# Patient Record
Sex: Female | Born: 1945 | Race: White | Hispanic: No | Marital: Married | Smoking: Former smoker
Health system: Southern US, Community
[De-identification: ages and names within clinical notes are randomized; demographics above are authoritative.]

## PROBLEM LIST (undated history)

## (undated) DIAGNOSIS — G20A1 Parkinson's disease without dyskinesia, without mention of fluctuations: Secondary | ICD-10-CM

## (undated) DIAGNOSIS — R1310 Dysphagia, unspecified: Secondary | ICD-10-CM

## (undated) DIAGNOSIS — F419 Anxiety disorder, unspecified: Secondary | ICD-10-CM

## (undated) DIAGNOSIS — G20 Parkinson's disease: Secondary | ICD-10-CM

---

## 1998-09-18 ENCOUNTER — Inpatient Hospital Stay (HOSPITAL_COMMUNITY): Admission: EM | Admit: 1998-09-18 | Discharge: 1998-09-21 | Payer: Self-pay

## 1998-09-20 ENCOUNTER — Encounter: Payer: Self-pay | Admitting: Internal Medicine

## 1998-10-11 ENCOUNTER — Ambulatory Visit (HOSPITAL_COMMUNITY): Admission: RE | Admit: 1998-10-11 | Discharge: 1998-10-11 | Payer: Self-pay | Admitting: Internal Medicine

## 1998-10-11 ENCOUNTER — Encounter: Payer: Self-pay | Admitting: Internal Medicine

## 1998-10-14 ENCOUNTER — Ambulatory Visit (HOSPITAL_COMMUNITY): Admission: RE | Admit: 1998-10-14 | Discharge: 1998-10-14 | Payer: Self-pay | Admitting: Internal Medicine

## 1998-10-14 ENCOUNTER — Encounter: Payer: Self-pay | Admitting: Internal Medicine

## 1998-12-14 ENCOUNTER — Ambulatory Visit (HOSPITAL_COMMUNITY): Admission: RE | Admit: 1998-12-14 | Discharge: 1998-12-14 | Payer: Self-pay | Admitting: Internal Medicine

## 1998-12-14 ENCOUNTER — Encounter: Payer: Self-pay | Admitting: Internal Medicine

## 1999-03-07 ENCOUNTER — Ambulatory Visit (HOSPITAL_COMMUNITY): Admission: RE | Admit: 1999-03-07 | Discharge: 1999-03-07 | Payer: Self-pay | Admitting: Internal Medicine

## 1999-03-07 ENCOUNTER — Encounter: Payer: Self-pay | Admitting: Internal Medicine

## 1999-12-01 ENCOUNTER — Other Ambulatory Visit: Admission: RE | Admit: 1999-12-01 | Discharge: 1999-12-01 | Payer: Self-pay | Admitting: Obstetrics and Gynecology

## 2000-01-03 ENCOUNTER — Other Ambulatory Visit: Admission: RE | Admit: 2000-01-03 | Discharge: 2000-01-03 | Payer: Self-pay | Admitting: Obstetrics and Gynecology

## 2001-04-15 ENCOUNTER — Other Ambulatory Visit: Admission: RE | Admit: 2001-04-15 | Discharge: 2001-04-15 | Payer: Self-pay | Admitting: Obstetrics and Gynecology

## 2009-09-01 HISTORY — PX: OTHER SURGICAL HISTORY: SHX169

## 2011-05-03 HISTORY — PX: SPLENECTOMY: SUR1306

## 2012-10-22 DIAGNOSIS — G20 Parkinson's disease: Secondary | ICD-10-CM | POA: Diagnosis not present

## 2012-11-28 DIAGNOSIS — G20 Parkinson's disease: Secondary | ICD-10-CM | POA: Diagnosis not present

## 2013-05-29 DIAGNOSIS — G20 Parkinson's disease: Secondary | ICD-10-CM | POA: Diagnosis not present

## 2013-07-08 DIAGNOSIS — Z23 Encounter for immunization: Secondary | ICD-10-CM | POA: Diagnosis not present

## 2013-10-11 ENCOUNTER — Emergency Department (INDEPENDENT_AMBULATORY_CARE_PROVIDER_SITE_OTHER)
Admission: EM | Admit: 2013-10-11 | Discharge: 2013-10-11 | Disposition: A | Discharge status: Home / Self Care | Payer: Medicare Other | Source: Home / Self Care | Attending: Emergency Medicine | Admitting: Emergency Medicine

## 2013-10-11 ENCOUNTER — Encounter: Payer: Self-pay | Admitting: Emergency Medicine

## 2013-10-11 ENCOUNTER — Emergency Department (INDEPENDENT_AMBULATORY_CARE_PROVIDER_SITE_OTHER): Payer: Medicare Other

## 2013-10-11 DIAGNOSIS — S20229A Contusion of unspecified back wall of thorax, initial encounter: Secondary | ICD-10-CM

## 2013-10-11 DIAGNOSIS — M545 Low back pain, unspecified: Secondary | ICD-10-CM | POA: Diagnosis not present

## 2013-10-11 DIAGNOSIS — G959 Disease of spinal cord, unspecified: Secondary | ICD-10-CM

## 2013-10-11 DIAGNOSIS — S59919A Unspecified injury of unspecified forearm, initial encounter: Secondary | ICD-10-CM | POA: Diagnosis not present

## 2013-10-11 DIAGNOSIS — S59909A Unspecified injury of unspecified elbow, initial encounter: Secondary | ICD-10-CM | POA: Diagnosis not present

## 2013-10-11 DIAGNOSIS — S60219A Contusion of unspecified wrist, initial encounter: Secondary | ICD-10-CM | POA: Diagnosis not present

## 2013-10-11 DIAGNOSIS — M79609 Pain in unspecified limb: Secondary | ICD-10-CM

## 2013-10-11 DIAGNOSIS — M25539 Pain in unspecified wrist: Secondary | ICD-10-CM | POA: Diagnosis not present

## 2013-10-11 DIAGNOSIS — S300XXA Contusion of lower back and pelvis, initial encounter: Secondary | ICD-10-CM

## 2013-10-11 DIAGNOSIS — S60211A Contusion of right wrist, initial encounter: Secondary | ICD-10-CM

## 2013-10-11 DIAGNOSIS — IMO0002 Reserved for concepts with insufficient information to code with codable children: Secondary | ICD-10-CM | POA: Diagnosis not present

## 2013-10-11 HISTORY — DX: Parkinson's disease: G20

## 2013-10-11 HISTORY — DX: Parkinson's disease without dyskinesia, without mention of fluctuations: G20.A1

## 2013-10-11 MED ORDER — IBUPROFEN 200 MG PO TABS: ORAL_TABLET | ORAL | Status: DC

## 2013-10-11 NOTE — ED Provider Notes (Signed)
CSN: [id number]     Arrival date & time 10/11/13  1257 History   First MD Initiated Contact with Patient 10/11/13 1323     Chief Complaint  Patient presents with  . Back Pain   Patient c/o low back pain and right wrist pain, states she fell down her stairs last Tuesday morning.  Patient is a 68 y.o. female presenting with back pain. The history is provided by the patient and the spouse.  Back Pain Location:  Lumbar spine Quality:  Aching Radiates to:  Does not radiate Pain severity:  Moderate Pain is:  Worse during the day Onset quality:  Sudden Duration:  4 days Timing:  Constant Progression:  Worsening Chronicity:  New Context: falling   Context: not occupational injury   Relieved by:  NSAIDs (Ibuprofen) Worsened by:  Bending and twisting Associated symptoms: no abdominal pain, no abdominal swelling, no chest pain, no dysuria, no fever, no headaches, no leg pain, no paresthesias and no pelvic pain   Risk factors: no steroid use   Risk factors comment:  Parkinson's disease  She and husband state that the Parkinson's disease has been fairly well controlled on the carbidopa-levodopa prescribed by her neurologist.  Denies loss of consciousness. Denies hip pain. She's been able to ambulate ever since the fall. Complains of right wrist pain, that's improving over time. Past Medical History  Diagnosis Date  . Parkinson disease    History reviewed. No pertinent past surgical history. History reviewed. No pertinent family history. History  Substance Use Topics  . Smoking status: Former Games developer  . Smokeless tobacco: Not on file  . Alcohol Use: No   OB History   Grav Para Term Preterm Abortions TAB SAB Ect Mult Living                 Review of Systems  Constitutional: Negative for fever.  Cardiovascular: Negative for chest pain.  Gastrointestinal: Negative for abdominal pain.  Genitourinary: Negative for dysuria and pelvic pain.  Musculoskeletal: Positive for back pain.   Neurological: Negative for headaches and paresthesias.  All other systems reviewed and are negative.    Allergies  Review of patient's allergies indicates no known allergies.  Home Medications   Current Outpatient Rx  Name  Route  Sig  Dispense  Refill  . carbidopa-levodopa (PARCOPA) 25-100 MG per disintegrating tablet   Oral   Take 1 tablet by mouth 3 (three) times daily.         Marland Kitchen ibuprofen (ADVIL,MOTRIN) 200 MG tablet      Take2 tablets ( 400 milligrams total) every 6 with food as needed for pain.   30 tablet   0    BP 135/86  Pulse 97  Temp(Src) 98.2 F (36.8 C) (Oral)  Resp 16  Ht 5\' 2"  (1.575 m)  Wt 112 lb (50.803 kg)  BMI 20.48 kg/m2  SpO2 96% Physical Exam  Nursing note and vitals reviewed. Constitutional: She is oriented to person, place, and time. She appears well-developed and well-nourished. No distress.  HENT:  Head: Normocephalic and atraumatic.  Mouth/Throat: Oropharynx is clear and moist.  Eyes: Conjunctivae and EOM are normal. Pupils are equal, round, and reactive to light. No scleral icterus.  Neck: Normal range of motion. No spinous process tenderness and no muscular tenderness present.  Cardiovascular: Normal rate, regular rhythm and normal heart sounds.   No murmur heard. Pulmonary/Chest: Effort normal and breath sounds normal. She exhibits no tenderness.  Abdominal: Soft. Normal appearance. She exhibits no distension.  There is no tenderness.  Musculoskeletal:       Right wrist: She exhibits tenderness, bony tenderness (With mild ecchymosis, distal ulna. Neurovascular distally and) and swelling (minimal, distal ulna. ).       Lumbar back: She exhibits decreased range of motion, tenderness and bony tenderness.  Neurological: She is alert and oriented to person, place, and time. She displays normal reflexes. No cranial nerve deficit or sensory deficit. Coordination normal.  Mild resting tremor right upper extremity . Flat facies, consistent with  Parkinson's.   Skin: Skin is warm. No rash noted.  Psychiatric: She has a normal mood and affect.  Mildly anxious   there is no open wound. She is able to walk without assistance ED Course  Procedures (including critical care time) Labs Review Labs Reviewed - No data to display Imaging Review Dg Lumbar Spine Complete  10/11/2013   CLINICAL DATA:  Fall down stairs with low back pain.  EXAM: LUMBAR SPINE - COMPLETE 4+ VIEW  COMPARISON:  None.  FINDINGS: No acute fracture or subluxation is identified. There is mild loss of the L2 vertebral body with approximately 20% loss of anterior vertebral body height. Based on appearance, this is likely old. No bony lesions are seen.  IMPRESSION: No acute fracture. 20% anterior compression of the L2 vertebral body likely represents old compression fracture.   Electronically Signed   By: Irish LackGlenn  Yamagata M.D.   On: 10/11/2013 14:32   Dg Wrist Complete Right  10/11/2013   CLINICAL DATA:  Wrist pain, fall  EXAM: RIGHT WRIST - COMPLETE 3+ VIEW  COMPARISON:  None.  FINDINGS: There is no evidence of fracture or dislocation. There is no evidence of arthropathy or other focal bone abnormality. Soft tissues are unremarkable.  IMPRESSION: Negative.   Electronically Signed   By: Ruel Favorsrevor  Shick M.D.   On: 10/11/2013 14:38    EKG Interpretation    Date/Time:    Ventricular Rate:    PR Interval:    QRS Duration:   QT Interval:    QTC Calculation:   R Axis:     Text Interpretation:              MDM   1. Lumbar contusion, initial encounter   2. Contusion, wrist, right, initial encounter    Reviewed x-rays and reports and results with patient and husband. No acute fractures seen. No dislocations. There is mild compression of the L2 vertebral body, likely representing old compression fracture, and patient and husband state that she had this prior diagnosis about 4 years ago.  Treatment options discussed, as well as risks, benefits, alternatives. Patient  and husband  voiced understanding and agreement with the following plans: Ibuprofen 200 mg, 2 every 6 hours with food, as this has helped the pain significantly. She declined any other pain medicine prescription or any muscle relaxant. Patient declined a wrist splint, as she prefers to either use one that she has at home or using Ace bandage that she has at home.--(Range of motion of right wrist is within normal limits and there is no significant pain with flexion or extension. Follow up with your primary care physician or specialist if not improving, having worsening of symptoms, or new severe symptoms.  Precautions discussed. Red flags discussed. Questions invited and answered. Patient and husband voiced understanding and agreement.   Lajean Manesavid Massey, MD 10/11/13 [phone]

## 2013-10-11 NOTE — ED Notes (Signed)
Patient c/o low back pain and neck stiffness states she fell down her stairs last Tuesday morning.

## 2013-11-04 DIAGNOSIS — G20 Parkinson's disease: Secondary | ICD-10-CM | POA: Diagnosis not present

## 2014-06-30 DIAGNOSIS — G20 Parkinson's disease: Secondary | ICD-10-CM | POA: Diagnosis not present

## 2014-07-15 DIAGNOSIS — Z23 Encounter for immunization: Secondary | ICD-10-CM | POA: Diagnosis not present

## 2014-10-04 ENCOUNTER — Emergency Department (INDEPENDENT_AMBULATORY_CARE_PROVIDER_SITE_OTHER)
Admission: EM | Admit: 2014-10-04 | Discharge: 2014-10-04 | Disposition: A | Discharge status: Home / Self Care | Payer: Medicare Other | Source: Home / Self Care | Attending: Family Medicine | Admitting: Family Medicine

## 2014-10-04 ENCOUNTER — Encounter: Payer: Self-pay | Admitting: Emergency Medicine

## 2014-10-04 DIAGNOSIS — N3090 Cystitis, unspecified without hematuria: Secondary | ICD-10-CM

## 2014-10-04 DIAGNOSIS — N390 Urinary tract infection, site not specified: Secondary | ICD-10-CM | POA: Diagnosis not present

## 2014-10-04 LAB — POCT URINALYSIS DIP (MANUAL ENTRY)
Bilirubin, UA: NEGATIVE
GLUCOSE UA: NEGATIVE
Ketones, POC UA: NEGATIVE
LEUKOCYTES UA: NEGATIVE
NITRITE UA: NEGATIVE
Protein Ur, POC: NEGATIVE
Spec Grav, UA: 1.02 (ref 1.005–1.03)
Urobilinogen, UA: 0.2 (ref 0–1)
pH, UA: 7 (ref 5–8)

## 2014-10-04 MED ORDER — PHENAZOPYRIDINE HCL 200 MG PO TABS: 200.0000 mg | ORAL_TABLET | Freq: Three times a day (TID) | ORAL | Status: DC

## 2014-10-04 MED ORDER — SULFAMETHOXAZOLE-TRIMETHOPRIM 800-160 MG PO TABS: 1.0000 | ORAL_TABLET | Freq: Two times a day (BID) | ORAL | Status: DC

## 2014-10-04 NOTE — ED Notes (Signed)
Reports sensation of pressure in bladder over past 1 week.

## 2014-10-04 NOTE — ED Provider Notes (Signed)
CSN: [id number]     Arrival date & time 10/04/14  1421 History   First MD Initiated Contact with Patient 10/04/14 1539     Chief Complaint  Patient presents with  . Dysuria      HPI Comments: During the past week patient has developed dysuria, urgency, and weaker urine stream.  She feels well otherwise.  Patient is a 69 y.o. female presenting with dysuria. The history is provided by the patient.  Dysuria Pain quality:  Burning Pain severity:  Mild Onset quality:  Gradual Duration:  1 week Timing:  Constant Progression:  Worsening Chronicity:  New Recent urinary tract infections: no   Relieved by:  Nothing Worsened by:  Nothing tried Ineffective treatments:  None tried Urinary symptoms: frequent urination and hesitancy   Urinary symptoms: no discolored urine, no foul-smelling urine, no hematuria and no bladder incontinence   Associated symptoms: no abdominal pain, no fever, no flank pain, no genital lesions, no nausea, no vaginal discharge and no vomiting     Past Medical History  Diagnosis Date  . Parkinson disease    History reviewed. No pertinent past surgical history. History reviewed. No pertinent family history. History  Substance Use Topics  . Smoking status: Former Games developer  . Smokeless tobacco: Not on file  . Alcohol Use: No   OB History    No data available     Review of Systems  Constitutional: Negative for fever.  Gastrointestinal: Negative for nausea, vomiting and abdominal pain.  Genitourinary: Positive for dysuria. Negative for flank pain and vaginal discharge.  All other systems reviewed and are negative.   Allergies  Review of patient's allergies indicates no known allergies.  Home Medications   Prior to Admission medications   Medication Sig Start Date End Date Taking? Authorizing Provider  carbidopa-levodopa (PARCOPA) 25-100 MG per disintegrating tablet Take 1 tablet by mouth 3 (three) times daily.    Historical Provider, MD  ibuprofen  (ADVIL,MOTRIN) 200 MG tablet Take2 tablets ( 400 milligrams total) every 6 with food as needed for pain. 10/11/13   Lajean Manes, MD  phenazopyridine (PYRIDIUM) 200 MG tablet Take 1 tablet (200 mg total) by mouth 3 (three) times daily. Take after meals. 10/04/14   Lattie Haw, MD  sulfamethoxazole-trimethoprim (BACTRIM DS,SEPTRA DS) 800-160 MG per tablet Take 1 tablet by mouth 2 (two) times daily. 10/04/14   Lattie Haw, MD   BP 108/67 mmHg  Pulse 78  Temp(Src) 97.6 F (36.4 C) (Oral)  Resp 16  Ht  (1.575 m)  Wt 105 lb (47.628 kg)  BMI 19.20 kg/m2  SpO2 98% Physical Exam Nursing notes and Vital Signs reviewed. Appearance:  Patient appears healthy, stated age, and in no acute distress Eyes:  Pupils are equal, round, and reactive to light and accomodation.  Extraocular movement is intact.  Conjunctivae are not inflamed  Pharynx:  Normal; moist mucous membranes  Neck:  Supple.  No adenopathy Lungs:  Clear to auscultation.  Breath sounds are equal.  Heart:  Regular rate and rhythm without murmurs, rubs, or gallops.  Abdomen:  Nontender without masses or hepatosplenomegaly.  Bowel sounds are present.  No CVA or flank tenderness.  Extremities:  No edema.  No calf tenderness Skin:  No rash present.   ED Course  Procedures  none   Labs Reviewed  URINE CULTURE  POCT URINALYSIS DIP (MANUAL ENTRY):  BLO trace intact; otherwise negative       MDM   1. Cystitis  Urine culture pending.  Begin Septra DS BID for 5 days.  Rx for Pyridium. Increase fluid intake. Followup with Family Doctor if not improved in one week.     Lattie Haw, MD 10/09/14 [phone]

## 2014-10-04 NOTE — Discharge Instructions (Signed)
Increase fluid intake ° ° °Urinary Tract Infection °Urinary tract infections (UTIs) can develop anywhere along your urinary tract. Your urinary tract is your body's drainage system for removing wastes and extra water. Your urinary tract includes two kidneys, two ureters, a bladder, and a urethra. Your kidneys are a pair of bean-shaped organs. Each kidney is about the size of your fist. They are located below your ribs, one on each side of your spine. °CAUSES °Infections are caused by microbes, which are microscopic organisms, including fungi, viruses, and bacteria. These organisms are so small that they can only be seen through a microscope. Bacteria are the microbes that most commonly cause UTIs. °SYMPTOMS  °Symptoms of UTIs may vary by age and gender of the patient and by the location of the infection. Symptoms in young women typically include a frequent and intense urge to urinate and a painful, burning feeling in the bladder or urethra during urination. Older women and men are more likely to be tired, shaky, and weak and have muscle aches and abdominal pain. A fever may mean the infection is in your kidneys. Other symptoms of a kidney infection include pain in your back or sides below the ribs, nausea, and vomiting. °DIAGNOSIS °To diagnose a UTI, your caregiver will ask you about your symptoms. Your caregiver also will ask to provide a urine sample. The urine sample will be tested for bacteria and white blood cells. White blood cells are made by your body to help fight infection. °TREATMENT  °Typically, UTIs can be treated with medication. Because most UTIs are caused by a bacterial infection, they usually can be treated with the use of antibiotics. The choice of antibiotic and length of treatment depend on your symptoms and the type of bacteria causing your infection. °HOME CARE INSTRUCTIONS °· If you were prescribed antibiotics, take them exactly as your caregiver instructs you. Finish the medication even if  you feel better after you have only taken some of the medication. °· Drink enough water and fluids to keep your urine clear or pale yellow. °· Avoid caffeine, tea, and carbonated beverages. They tend to irritate your bladder. °· Empty your bladder often. Avoid holding urine for long periods of time. °· Empty your bladder before and after sexual intercourse. °· After a bowel movement, women should cleanse from front to back. Use each tissue only once. °SEEK MEDICAL CARE IF:  °· You have back pain. °· You develop a fever. °· Your symptoms do not begin to resolve within 3 days. °SEEK IMMEDIATE MEDICAL CARE IF:  °· You have severe back pain or lower abdominal pain. °· You develop chills. °· You have nausea or vomiting. °· You have continued burning or discomfort with urination. °MAKE SURE YOU:  °· Understand these instructions. °· Will watch your condition. °· Will get help right away if you are not doing well or get worse. °Document Released: 06/28/2005 Document Revised: 03/19/2012 Document Reviewed: 10/27/2011 °ExitCare® Patient Information ©2015 [employer]. This information is not intended to replace advice given to you by your health care provider. Make sure you discuss any questions you have with your health care provider. ° °

## 2014-10-05 LAB — URINE CULTURE

## 2014-10-07 ENCOUNTER — Telehealth: Payer: Self-pay | Admitting: *Deleted

## 2014-10-14 ENCOUNTER — Telehealth: Payer: Self-pay | Admitting: *Deleted

## 2014-10-14 MED ORDER — NITROFURANTOIN MONOHYD MACRO 100 MG PO CAPS: 100.0000 mg | ORAL_CAPSULE | Freq: Two times a day (BID) | ORAL | Status: DC

## 2014-11-03 ENCOUNTER — Ambulatory Visit (INDEPENDENT_AMBULATORY_CARE_PROVIDER_SITE_OTHER): Payer: Medicare Other | Admitting: Physician Assistant

## 2014-11-03 ENCOUNTER — Encounter: Payer: Self-pay | Admitting: Physician Assistant

## 2014-11-03 VITALS — BP 120/74 | HR 86 | Ht 62.0 in | Wt 106.0 lb

## 2014-11-03 DIAGNOSIS — G20 Parkinson's disease: Secondary | ICD-10-CM

## 2014-11-03 DIAGNOSIS — N3090 Cystitis, unspecified without hematuria: Secondary | ICD-10-CM | POA: Diagnosis not present

## 2014-11-03 DIAGNOSIS — N3289 Other specified disorders of bladder: Secondary | ICD-10-CM | POA: Diagnosis not present

## 2014-11-03 DIAGNOSIS — R3989 Other symptoms and signs involving the genitourinary system: Secondary | ICD-10-CM

## 2014-11-03 DIAGNOSIS — G20A1 Parkinson's disease without dyskinesia, without mention of fluctuations: Secondary | ICD-10-CM

## 2014-11-03 LAB — POCT URINALYSIS DIPSTICK
BILIRUBIN UA: NEGATIVE
GLUCOSE UA: NEGATIVE
Ketones, UA: NEGATIVE
LEUKOCYTES UA: NEGATIVE
NITRITE UA: NEGATIVE
Protein, UA: NEGATIVE
Spec Grav, UA: 1.02
UROBILINOGEN UA: 0.2
pH, UA: 7

## 2014-11-03 NOTE — Patient Instructions (Signed)
Ok to start probiotic.

## 2014-11-04 DIAGNOSIS — N3090 Cystitis, unspecified without hematuria: Secondary | ICD-10-CM | POA: Insufficient documentation

## 2014-11-04 NOTE — Progress Notes (Signed)
   Subjective:    Patient ID: Tara Reid, female    DOB: [birth year], 69 y.o.   MRN: [id number]  HPI Pt is a 69 yo female who presents to the clinic to establish care.   .. Active Ambulatory Problems    Diagnosis Date Noted  . Parkinson disease    Resolved Ambulatory Problems    Diagnosis Date Noted  . No Resolved Ambulatory Problems   No Additional Past Medical History   .Marland Kitchen.History reviewed. No pertinent family history. .. History   Social History  . Marital Status: Married    Spouse Name: N/A    Number of Children: N/A  . Years of Education: N/A   Occupational History  . Not on file.   Social History Main Topics  . Smoking status: Former Games developermoker  . Smokeless tobacco: Not on file  . Alcohol Use: No  . Drug Use: No  . Sexual Activity: No   Other Topics Concern  . Not on file   Social History Narrative    Pt comes in to follow up on bladder infection. She was first given cipro and then had to take 7 days of macrobid. Symptoms have mostly improved but has some residual bladder pressure sensation. No burning, abdominal pain or back pain. Since starting abx has had loose stools and "gurgly" abdomen sounds. She denies any fever.   Review of Systems  All other systems reviewed and are negative.      Objective:   Physical Exam  Constitutional: She is oriented to person, place, and time. She appears well-developed and well-nourished.  HENT:  Head: Normocephalic and atraumatic.  Cardiovascular: Normal rate, regular rhythm and normal heart sounds.   Pulmonary/Chest: Effort normal and breath sounds normal. She has no wheezes.  Abdominal: Soft. Bowel sounds are normal. She exhibits no distension and no mass. There is no tenderness. There is no rebound and no guarding.  Neurological: She is alert and oriented to person, place, and time.  Skin: Skin is dry.  Psychiatric: She has a normal mood and affect. Her behavior is normal.          Assessment & Plan:   Parkinson's disease- dx in 2012. managed by neurologist.   Cystitis/pressure sensation in bladder- .. Results for orders placed or performed in visit on 11/03/14  POCT urinalysis dipstick  Result Value Ref Range   Color, UA yellow    Clarity, UA clear    Glucose, UA neg    Bilirubin, UA neg    Ketones, UA neg    Spec Grav, UA 1.020    Blood, UA trace-intact    pH, UA 7.0    Protein, UA neg    Urobilinogen, UA 0.2    Nitrite, UA neg    Leukocytes, UA Negative    will culture to make sure infection is resolved.  Stomach upset/loose stools- likely due to 2 round of antibiotics. Suggested probiotic to replace good bacteria. Follow up as needed.   Discussed need for fasting labs and CPE.

## 2014-11-05 LAB — URINE CULTURE: Colony Count: 9000

## 2014-11-10 ENCOUNTER — Ambulatory Visit (INDEPENDENT_AMBULATORY_CARE_PROVIDER_SITE_OTHER): Payer: Medicare Other | Admitting: Physician Assistant

## 2014-11-10 ENCOUNTER — Encounter: Payer: Self-pay | Admitting: Physician Assistant

## 2014-11-10 VITALS — BP 148/84 | HR 93 | Temp 98.2°F | Ht 62.0 in | Wt 105.0 lb

## 2014-11-10 DIAGNOSIS — R300 Dysuria: Secondary | ICD-10-CM | POA: Diagnosis not present

## 2014-11-10 DIAGNOSIS — N812 Incomplete uterovaginal prolapse: Secondary | ICD-10-CM | POA: Insufficient documentation

## 2014-11-10 DIAGNOSIS — R102 Pelvic and perineal pain: Secondary | ICD-10-CM

## 2014-11-10 DIAGNOSIS — R350 Frequency of micturition: Secondary | ICD-10-CM | POA: Diagnosis not present

## 2014-11-10 DIAGNOSIS — N9489 Other specified conditions associated with female genital organs and menstrual cycle: Secondary | ICD-10-CM

## 2014-11-10 LAB — POCT URINALYSIS DIPSTICK
Bilirubin, UA: NEGATIVE
Glucose, UA: NEGATIVE
Ketones, UA: NEGATIVE
LEUKOCYTES UA: NEGATIVE
NITRITE UA: NEGATIVE
Protein, UA: NEGATIVE
RBC UA: NEGATIVE
Spec Grav, UA: 1.01
UROBILINOGEN UA: 0.2
pH, UA: 7

## 2014-11-10 NOTE — Patient Instructions (Signed)
Will call with urologist.

## 2014-11-10 NOTE — Progress Notes (Signed)
   Subjective:    Patient ID: Tara PickettMarjorie B Reid, female    DOB: 11-Mar-1946, 69 y.o.   MRN: [id number]  HPI Pt presents to the clinic with unresolved increased pelvic pressure and urinary frequency. Pain can be very intense but most of the time managable. Not taking anything for it. Getting up multiple times a night to urinate. Burns some of the time. No leakage. No fever, chills, flank pain.    Review of Systems  All other systems reviewed and are negative.      Objective:   Physical Exam  Constitutional: She is oriented to person, place, and time. She appears well-developed and well-nourished.  HENT:  Head: Normocephalic and atraumatic.  Cardiovascular: Normal rate, regular rhythm and normal heart sounds.   Pulmonary/Chest: Effort normal and breath sounds normal.  NO CVA tenderness.   Abdominal: Soft. Bowel sounds are normal. She exhibits no distension and no mass. There is no tenderness. There is no rebound and no guarding.  Genitourinary:  Pelvic exam was done. Appears to have a slight prolapse of uterus in vaginal canal but I do not think causing symptoms.   Urethral meatus appears normal.   Neurological: She is alert and oriented to person, place, and time.  Skin: Skin is dry.  Psychiatric: She has a normal mood and affect. Her behavior is normal.          Assessment & Plan:  Urinary frequency/pelvic pressure/dysuria- negative leuks, nitrates, protein, blood. Will culture. Last culture was negative as well. Reassured pt not infection. Appears to be slight uterine prolapse but I do not think causing symptoms. Will get pelvic u/s to evaluate pelvic pain and pressure. I would like to start oxybutin for OAB symptoms. Pt does not want to start any medication and would like referral to urology. Will make today.

## 2014-11-12 LAB — URINE CULTURE: Colony Count: 55000

## 2014-11-13 DIAGNOSIS — R102 Pelvic and perineal pain: Secondary | ICD-10-CM | POA: Diagnosis not present

## 2014-11-13 DIAGNOSIS — N3020 Other chronic cystitis without hematuria: Secondary | ICD-10-CM | POA: Diagnosis not present

## 2014-11-16 ENCOUNTER — Encounter: Payer: Self-pay | Admitting: Physician Assistant

## 2014-11-16 DIAGNOSIS — N3020 Other chronic cystitis without hematuria: Secondary | ICD-10-CM | POA: Insufficient documentation

## 2014-11-16 DIAGNOSIS — R102 Pelvic and perineal pain: Secondary | ICD-10-CM | POA: Insufficient documentation

## 2014-12-17 DIAGNOSIS — R102 Pelvic and perineal pain: Secondary | ICD-10-CM | POA: Diagnosis not present

## 2014-12-22 DIAGNOSIS — M62838 Other muscle spasm: Secondary | ICD-10-CM | POA: Diagnosis not present

## 2014-12-22 DIAGNOSIS — M6281 Muscle weakness (generalized): Secondary | ICD-10-CM | POA: Diagnosis not present

## 2014-12-22 DIAGNOSIS — R278 Other lack of coordination: Secondary | ICD-10-CM | POA: Diagnosis not present

## 2014-12-22 DIAGNOSIS — R102 Pelvic and perineal pain: Secondary | ICD-10-CM | POA: Diagnosis not present

## 2014-12-28 DIAGNOSIS — M62838 Other muscle spasm: Secondary | ICD-10-CM | POA: Diagnosis not present

## 2014-12-28 DIAGNOSIS — R102 Pelvic and perineal pain: Secondary | ICD-10-CM | POA: Diagnosis not present

## 2014-12-28 DIAGNOSIS — M6281 Muscle weakness (generalized): Secondary | ICD-10-CM | POA: Diagnosis not present

## 2014-12-28 DIAGNOSIS — R278 Other lack of coordination: Secondary | ICD-10-CM | POA: Diagnosis not present

## 2014-12-29 DIAGNOSIS — G20 Parkinson's disease: Secondary | ICD-10-CM | POA: Diagnosis not present

## 2015-01-05 DIAGNOSIS — M6281 Muscle weakness (generalized): Secondary | ICD-10-CM | POA: Diagnosis not present

## 2015-01-05 DIAGNOSIS — R278 Other lack of coordination: Secondary | ICD-10-CM | POA: Diagnosis not present

## 2015-01-05 DIAGNOSIS — R102 Pelvic and perineal pain: Secondary | ICD-10-CM | POA: Diagnosis not present

## 2015-01-05 DIAGNOSIS — M62838 Other muscle spasm: Secondary | ICD-10-CM | POA: Diagnosis not present

## 2015-01-05 DIAGNOSIS — N3020 Other chronic cystitis without hematuria: Secondary | ICD-10-CM | POA: Diagnosis not present

## 2015-01-14 DIAGNOSIS — R278 Other lack of coordination: Secondary | ICD-10-CM | POA: Diagnosis not present

## 2015-01-14 DIAGNOSIS — M62838 Other muscle spasm: Secondary | ICD-10-CM | POA: Diagnosis not present

## 2015-01-14 DIAGNOSIS — R102 Pelvic and perineal pain: Secondary | ICD-10-CM | POA: Diagnosis not present

## 2015-01-14 DIAGNOSIS — N3020 Other chronic cystitis without hematuria: Secondary | ICD-10-CM | POA: Diagnosis not present

## 2015-01-14 DIAGNOSIS — M6281 Muscle weakness (generalized): Secondary | ICD-10-CM | POA: Diagnosis not present

## 2015-01-21 DIAGNOSIS — M6281 Muscle weakness (generalized): Secondary | ICD-10-CM | POA: Diagnosis not present

## 2015-01-21 DIAGNOSIS — R278 Other lack of coordination: Secondary | ICD-10-CM | POA: Diagnosis not present

## 2015-01-21 DIAGNOSIS — R102 Pelvic and perineal pain: Secondary | ICD-10-CM | POA: Diagnosis not present

## 2015-01-21 DIAGNOSIS — N3020 Other chronic cystitis without hematuria: Secondary | ICD-10-CM | POA: Diagnosis not present

## 2015-01-21 DIAGNOSIS — M62838 Other muscle spasm: Secondary | ICD-10-CM | POA: Diagnosis not present

## 2015-01-28 DIAGNOSIS — M62838 Other muscle spasm: Secondary | ICD-10-CM | POA: Diagnosis not present

## 2015-01-28 DIAGNOSIS — M6281 Muscle weakness (generalized): Secondary | ICD-10-CM | POA: Diagnosis not present

## 2015-01-28 DIAGNOSIS — R102 Pelvic and perineal pain: Secondary | ICD-10-CM | POA: Diagnosis not present

## 2015-01-28 DIAGNOSIS — N3020 Other chronic cystitis without hematuria: Secondary | ICD-10-CM | POA: Diagnosis not present

## 2015-01-28 DIAGNOSIS — R278 Other lack of coordination: Secondary | ICD-10-CM | POA: Diagnosis not present

## 2015-02-02 DIAGNOSIS — N3020 Other chronic cystitis without hematuria: Secondary | ICD-10-CM | POA: Diagnosis not present

## 2015-02-02 DIAGNOSIS — M6281 Muscle weakness (generalized): Secondary | ICD-10-CM | POA: Diagnosis not present

## 2015-02-02 DIAGNOSIS — R102 Pelvic and perineal pain: Secondary | ICD-10-CM | POA: Diagnosis not present

## 2015-02-02 DIAGNOSIS — R278 Other lack of coordination: Secondary | ICD-10-CM | POA: Diagnosis not present

## 2015-02-02 DIAGNOSIS — M62838 Other muscle spasm: Secondary | ICD-10-CM | POA: Diagnosis not present

## 2015-02-09 DIAGNOSIS — M62838 Other muscle spasm: Secondary | ICD-10-CM | POA: Diagnosis not present

## 2015-02-09 DIAGNOSIS — R102 Pelvic and perineal pain: Secondary | ICD-10-CM | POA: Diagnosis not present

## 2015-02-09 DIAGNOSIS — R278 Other lack of coordination: Secondary | ICD-10-CM | POA: Diagnosis not present

## 2015-02-09 DIAGNOSIS — N3020 Other chronic cystitis without hematuria: Secondary | ICD-10-CM | POA: Diagnosis not present

## 2015-02-23 DIAGNOSIS — M6281 Muscle weakness (generalized): Secondary | ICD-10-CM | POA: Diagnosis not present

## 2015-02-23 DIAGNOSIS — R278 Other lack of coordination: Secondary | ICD-10-CM | POA: Diagnosis not present

## 2015-02-23 DIAGNOSIS — M62838 Other muscle spasm: Secondary | ICD-10-CM | POA: Diagnosis not present

## 2015-02-23 DIAGNOSIS — R102 Pelvic and perineal pain: Secondary | ICD-10-CM | POA: Diagnosis not present

## 2015-03-11 DIAGNOSIS — M62838 Other muscle spasm: Secondary | ICD-10-CM | POA: Diagnosis not present

## 2015-03-11 DIAGNOSIS — R102 Pelvic and perineal pain: Secondary | ICD-10-CM | POA: Diagnosis not present

## 2015-03-11 DIAGNOSIS — R278 Other lack of coordination: Secondary | ICD-10-CM | POA: Diagnosis not present

## 2015-03-11 DIAGNOSIS — N3020 Other chronic cystitis without hematuria: Secondary | ICD-10-CM | POA: Diagnosis not present

## 2015-03-11 DIAGNOSIS — M6281 Muscle weakness (generalized): Secondary | ICD-10-CM | POA: Diagnosis not present

## 2015-04-08 DIAGNOSIS — R102 Pelvic and perineal pain: Secondary | ICD-10-CM | POA: Diagnosis not present

## 2015-04-15 DIAGNOSIS — M6281 Muscle weakness (generalized): Secondary | ICD-10-CM | POA: Diagnosis not present

## 2015-04-15 DIAGNOSIS — M62838 Other muscle spasm: Secondary | ICD-10-CM | POA: Diagnosis not present

## 2015-04-15 DIAGNOSIS — N3020 Other chronic cystitis without hematuria: Secondary | ICD-10-CM | POA: Diagnosis not present

## 2015-04-15 DIAGNOSIS — R102 Pelvic and perineal pain: Secondary | ICD-10-CM | POA: Diagnosis not present

## 2015-04-15 DIAGNOSIS — R278 Other lack of coordination: Secondary | ICD-10-CM | POA: Diagnosis not present

## 2015-07-06 DIAGNOSIS — G20 Parkinson's disease: Secondary | ICD-10-CM | POA: Diagnosis not present

## 2016-01-04 DIAGNOSIS — G20 Parkinson's disease: Secondary | ICD-10-CM | POA: Diagnosis not present

## 2016-03-31 DIAGNOSIS — G20 Parkinson's disease: Secondary | ICD-10-CM | POA: Diagnosis not present

## 2016-06-08 DIAGNOSIS — G20 Parkinson's disease: Secondary | ICD-10-CM | POA: Diagnosis not present

## 2016-06-17 DIAGNOSIS — R911 Solitary pulmonary nodule: Secondary | ICD-10-CM | POA: Diagnosis not present

## 2016-06-17 DIAGNOSIS — S0990XA Unspecified injury of head, initial encounter: Secondary | ICD-10-CM | POA: Diagnosis not present

## 2016-06-17 DIAGNOSIS — Z79899 Other long term (current) drug therapy: Secondary | ICD-10-CM | POA: Diagnosis not present

## 2016-06-17 DIAGNOSIS — G4700 Insomnia, unspecified: Secondary | ICD-10-CM | POA: Diagnosis not present

## 2016-06-17 DIAGNOSIS — G20 Parkinson's disease: Secondary | ICD-10-CM | POA: Diagnosis not present

## 2016-06-17 DIAGNOSIS — R4182 Altered mental status, unspecified: Secondary | ICD-10-CM | POA: Diagnosis not present

## 2016-06-17 DIAGNOSIS — M542 Cervicalgia: Secondary | ICD-10-CM | POA: Diagnosis not present

## 2016-06-17 DIAGNOSIS — Z9081 Acquired absence of spleen: Secondary | ICD-10-CM | POA: Diagnosis not present

## 2016-06-17 DIAGNOSIS — J449 Chronic obstructive pulmonary disease, unspecified: Secondary | ICD-10-CM | POA: Diagnosis not present

## 2016-06-17 DIAGNOSIS — F0390 Unspecified dementia without behavioral disturbance: Secondary | ICD-10-CM | POA: Diagnosis not present

## 2016-06-22 DIAGNOSIS — G20 Parkinson's disease: Secondary | ICD-10-CM | POA: Diagnosis not present

## 2016-06-22 DIAGNOSIS — N390 Urinary tract infection, site not specified: Secondary | ICD-10-CM | POA: Diagnosis not present

## 2016-06-22 DIAGNOSIS — G4700 Insomnia, unspecified: Secondary | ICD-10-CM | POA: Diagnosis not present

## 2016-06-22 DIAGNOSIS — F0391 Unspecified dementia with behavioral disturbance: Secondary | ICD-10-CM | POA: Diagnosis not present

## 2016-06-22 DIAGNOSIS — Z87891 Personal history of nicotine dependence: Secondary | ICD-10-CM | POA: Diagnosis not present

## 2016-06-22 DIAGNOSIS — F0281 Dementia in other diseases classified elsewhere with behavioral disturbance: Secondary | ICD-10-CM | POA: Diagnosis not present

## 2016-06-22 DIAGNOSIS — R251 Tremor, unspecified: Secondary | ICD-10-CM | POA: Diagnosis not present

## 2016-07-27 DIAGNOSIS — F419 Anxiety disorder, unspecified: Secondary | ICD-10-CM | POA: Diagnosis not present

## 2016-07-27 DIAGNOSIS — R251 Tremor, unspecified: Secondary | ICD-10-CM | POA: Diagnosis not present

## 2016-07-27 DIAGNOSIS — R443 Hallucinations, unspecified: Secondary | ICD-10-CM | POA: Diagnosis not present

## 2016-07-27 DIAGNOSIS — Z9181 History of falling: Secondary | ICD-10-CM | POA: Diagnosis not present

## 2016-07-27 DIAGNOSIS — G20 Parkinson's disease: Secondary | ICD-10-CM | POA: Diagnosis not present

## 2016-08-06 DIAGNOSIS — I10 Essential (primary) hypertension: Secondary | ICD-10-CM | POA: Diagnosis present

## 2016-08-06 DIAGNOSIS — R1310 Dysphagia, unspecified: Secondary | ICD-10-CM | POA: Diagnosis not present

## 2016-08-06 DIAGNOSIS — M545 Low back pain: Secondary | ICD-10-CM | POA: Diagnosis not present

## 2016-08-06 DIAGNOSIS — R51 Headache: Secondary | ICD-10-CM | POA: Diagnosis not present

## 2016-08-06 DIAGNOSIS — S2241XA Multiple fractures of ribs, right side, initial encounter for closed fracture: Secondary | ICD-10-CM | POA: Diagnosis not present

## 2016-08-06 DIAGNOSIS — S32058A Other fracture of fifth lumbar vertebra, initial encounter for closed fracture: Secondary | ICD-10-CM | POA: Diagnosis not present

## 2016-08-06 DIAGNOSIS — W19XXXA Unspecified fall, initial encounter: Secondary | ICD-10-CM | POA: Diagnosis not present

## 2016-08-06 DIAGNOSIS — J984 Other disorders of lung: Secondary | ICD-10-CM | POA: Diagnosis present

## 2016-08-06 DIAGNOSIS — E43 Unspecified severe protein-calorie malnutrition: Secondary | ICD-10-CM | POA: Diagnosis present

## 2016-08-06 DIAGNOSIS — R41841 Cognitive communication deficit: Secondary | ICD-10-CM | POA: Diagnosis not present

## 2016-08-06 DIAGNOSIS — R1319 Other dysphagia: Secondary | ICD-10-CM | POA: Diagnosis present

## 2016-08-06 DIAGNOSIS — R0789 Other chest pain: Secondary | ICD-10-CM | POA: Diagnosis not present

## 2016-08-06 DIAGNOSIS — T7611XA Adult physical abuse, suspected, initial encounter: Secondary | ICD-10-CM | POA: Diagnosis present

## 2016-08-06 DIAGNOSIS — D62 Acute posthemorrhagic anemia: Secondary | ICD-10-CM | POA: Diagnosis present

## 2016-08-06 DIAGNOSIS — S60811A Abrasion of right wrist, initial encounter: Secondary | ICD-10-CM | POA: Diagnosis not present

## 2016-08-06 DIAGNOSIS — M6281 Muscle weakness (generalized): Secondary | ICD-10-CM | POA: Diagnosis not present

## 2016-08-06 DIAGNOSIS — M542 Cervicalgia: Secondary | ICD-10-CM | POA: Diagnosis not present

## 2016-08-06 DIAGNOSIS — S32028A Other fracture of second lumbar vertebra, initial encounter for closed fracture: Secondary | ICD-10-CM | POA: Diagnosis not present

## 2016-08-06 DIAGNOSIS — Z781 Physical restraint status: Secondary | ICD-10-CM | POA: Diagnosis not present

## 2016-08-06 DIAGNOSIS — Z7409 Other reduced mobility: Secondary | ICD-10-CM | POA: Diagnosis not present

## 2016-08-06 DIAGNOSIS — Z048 Encounter for examination and observation for other specified reasons: Secondary | ICD-10-CM | POA: Diagnosis not present

## 2016-08-06 DIAGNOSIS — F0390 Unspecified dementia without behavioral disturbance: Secondary | ICD-10-CM | POA: Diagnosis not present

## 2016-08-06 DIAGNOSIS — M8008XA Age-related osteoporosis with current pathological fracture, vertebra(e), initial encounter for fracture: Secondary | ICD-10-CM | POA: Diagnosis not present

## 2016-08-06 DIAGNOSIS — G20 Parkinson's disease: Secondary | ICD-10-CM | POA: Diagnosis present

## 2016-08-06 DIAGNOSIS — S32059A Unspecified fracture of fifth lumbar vertebra, initial encounter for closed fracture: Secondary | ICD-10-CM | POA: Diagnosis not present

## 2016-08-06 DIAGNOSIS — Z4789 Encounter for other orthopedic aftercare: Secondary | ICD-10-CM | POA: Diagnosis not present

## 2016-08-06 DIAGNOSIS — S32059D Unspecified fracture of fifth lumbar vertebra, subsequent encounter for fracture with routine healing: Secondary | ICD-10-CM | POA: Diagnosis not present

## 2016-08-06 DIAGNOSIS — R1312 Dysphagia, oropharyngeal phase: Secondary | ICD-10-CM | POA: Diagnosis present

## 2016-08-06 DIAGNOSIS — S2242XD Multiple fractures of ribs, left side, subsequent encounter for fracture with routine healing: Secondary | ICD-10-CM | POA: Diagnosis not present

## 2016-08-06 DIAGNOSIS — F419 Anxiety disorder, unspecified: Secondary | ICD-10-CM | POA: Diagnosis not present

## 2016-08-06 DIAGNOSIS — S2243XA Multiple fractures of ribs, bilateral, initial encounter for closed fracture: Secondary | ICD-10-CM | POA: Diagnosis not present

## 2016-08-06 DIAGNOSIS — S2232XA Fracture of one rib, left side, initial encounter for closed fracture: Secondary | ICD-10-CM | POA: Diagnosis not present

## 2016-08-06 DIAGNOSIS — W19XXXD Unspecified fall, subsequent encounter: Secondary | ICD-10-CM | POA: Diagnosis not present

## 2016-08-06 DIAGNOSIS — Z681 Body mass index (BMI) 19 or less, adult: Secondary | ICD-10-CM | POA: Diagnosis not present

## 2016-08-06 DIAGNOSIS — G8911 Acute pain due to trauma: Secondary | ICD-10-CM | POA: Diagnosis not present

## 2016-08-06 DIAGNOSIS — B9620 Unspecified Escherichia coli [E. coli] as the cause of diseases classified elsewhere: Secondary | ICD-10-CM | POA: Diagnosis not present

## 2016-08-06 DIAGNOSIS — T7411XA Adult physical abuse, confirmed, initial encounter: Secondary | ICD-10-CM | POA: Diagnosis not present

## 2016-08-06 DIAGNOSIS — R0781 Pleurodynia: Secondary | ICD-10-CM | POA: Diagnosis not present

## 2016-08-06 DIAGNOSIS — R079 Chest pain, unspecified: Secondary | ICD-10-CM | POA: Diagnosis not present

## 2016-08-06 DIAGNOSIS — S60812A Abrasion of left wrist, initial encounter: Secondary | ICD-10-CM | POA: Diagnosis not present

## 2016-08-06 DIAGNOSIS — R54 Age-related physical debility: Secondary | ICD-10-CM | POA: Diagnosis present

## 2016-08-06 DIAGNOSIS — S2241XD Multiple fractures of ribs, right side, subsequent encounter for fracture with routine healing: Secondary | ICD-10-CM | POA: Diagnosis not present

## 2016-08-06 DIAGNOSIS — N390 Urinary tract infection, site not specified: Secondary | ICD-10-CM | POA: Diagnosis not present

## 2016-08-06 DIAGNOSIS — R4182 Altered mental status, unspecified: Secondary | ICD-10-CM | POA: Diagnosis not present

## 2016-08-06 DIAGNOSIS — R109 Unspecified abdominal pain: Secondary | ICD-10-CM | POA: Diagnosis not present

## 2016-08-06 DIAGNOSIS — R52 Pain, unspecified: Secondary | ICD-10-CM | POA: Diagnosis not present

## 2016-08-06 DIAGNOSIS — L89151 Pressure ulcer of sacral region, stage 1: Secondary | ICD-10-CM | POA: Diagnosis not present

## 2016-08-06 DIAGNOSIS — F0280 Dementia in other diseases classified elsewhere without behavioral disturbance: Secondary | ICD-10-CM | POA: Diagnosis present

## 2016-08-06 DIAGNOSIS — Z7901 Long term (current) use of anticoagulants: Secondary | ICD-10-CM | POA: Diagnosis not present

## 2016-08-08 ENCOUNTER — Ambulatory Visit: Payer: No Typology Code available for payment source | Admitting: Physician Assistant

## 2016-08-23 DIAGNOSIS — S2243XA Multiple fractures of ribs, bilateral, initial encounter for closed fracture: Secondary | ICD-10-CM | POA: Diagnosis not present

## 2016-08-23 DIAGNOSIS — R41841 Cognitive communication deficit: Secondary | ICD-10-CM | POA: Diagnosis not present

## 2016-08-23 DIAGNOSIS — Z79899 Other long term (current) drug therapy: Secondary | ICD-10-CM | POA: Diagnosis not present

## 2016-08-23 DIAGNOSIS — F419 Anxiety disorder, unspecified: Secondary | ICD-10-CM | POA: Diagnosis not present

## 2016-08-23 DIAGNOSIS — F05 Delirium due to known physiological condition: Secondary | ICD-10-CM | POA: Diagnosis not present

## 2016-08-23 DIAGNOSIS — T148XXA Other injury of unspecified body region, initial encounter: Secondary | ICD-10-CM | POA: Diagnosis not present

## 2016-08-23 DIAGNOSIS — Z87891 Personal history of nicotine dependence: Secondary | ICD-10-CM | POA: Diagnosis not present

## 2016-08-23 DIAGNOSIS — F0390 Unspecified dementia without behavioral disturbance: Secondary | ICD-10-CM | POA: Diagnosis not present

## 2016-08-23 DIAGNOSIS — M79606 Pain in leg, unspecified: Secondary | ICD-10-CM | POA: Diagnosis not present

## 2016-08-23 DIAGNOSIS — I621 Nontraumatic extradural hemorrhage: Secondary | ICD-10-CM | POA: Diagnosis not present

## 2016-08-23 DIAGNOSIS — Y929 Unspecified place or not applicable: Secondary | ICD-10-CM | POA: Diagnosis not present

## 2016-08-23 DIAGNOSIS — Y999 Unspecified external cause status: Secondary | ICD-10-CM | POA: Diagnosis not present

## 2016-08-23 DIAGNOSIS — S2243XD Multiple fractures of ribs, bilateral, subsequent encounter for fracture with routine healing: Secondary | ICD-10-CM | POA: Diagnosis not present

## 2016-08-23 DIAGNOSIS — S2242XD Multiple fractures of ribs, left side, subsequent encounter for fracture with routine healing: Secondary | ICD-10-CM | POA: Diagnosis not present

## 2016-08-23 DIAGNOSIS — S32059D Unspecified fracture of fifth lumbar vertebra, subsequent encounter for fracture with routine healing: Secondary | ICD-10-CM | POA: Diagnosis not present

## 2016-08-23 DIAGNOSIS — S2241XD Multiple fractures of ribs, right side, subsequent encounter for fracture with routine healing: Secondary | ICD-10-CM | POA: Diagnosis not present

## 2016-08-23 DIAGNOSIS — F329 Major depressive disorder, single episode, unspecified: Secondary | ICD-10-CM | POA: Diagnosis not present

## 2016-08-23 DIAGNOSIS — S32058A Other fracture of fifth lumbar vertebra, initial encounter for closed fracture: Secondary | ICD-10-CM | POA: Diagnosis not present

## 2016-08-23 DIAGNOSIS — G3183 Dementia with Lewy bodies: Secondary | ICD-10-CM | POA: Diagnosis not present

## 2016-08-23 DIAGNOSIS — W19XXXD Unspecified fall, subsequent encounter: Secondary | ICD-10-CM | POA: Diagnosis not present

## 2016-08-23 DIAGNOSIS — G20 Parkinson's disease: Secondary | ICD-10-CM | POA: Diagnosis not present

## 2016-08-23 DIAGNOSIS — Z7409 Other reduced mobility: Secondary | ICD-10-CM | POA: Diagnosis not present

## 2016-08-23 DIAGNOSIS — Z4789 Encounter for other orthopedic aftercare: Secondary | ICD-10-CM | POA: Diagnosis not present

## 2016-08-23 DIAGNOSIS — R1312 Dysphagia, oropharyngeal phase: Secondary | ICD-10-CM | POA: Diagnosis not present

## 2016-08-23 DIAGNOSIS — Y939 Activity, unspecified: Secondary | ICD-10-CM | POA: Diagnosis not present

## 2016-08-23 DIAGNOSIS — W19XXXA Unspecified fall, initial encounter: Secondary | ICD-10-CM | POA: Diagnosis not present

## 2016-08-23 DIAGNOSIS — F0280 Dementia in other diseases classified elsewhere without behavioral disturbance: Secondary | ICD-10-CM | POA: Diagnosis not present

## 2016-08-23 DIAGNOSIS — S79911A Unspecified injury of right hip, initial encounter: Secondary | ICD-10-CM | POA: Diagnosis not present

## 2016-08-23 DIAGNOSIS — M6281 Muscle weakness (generalized): Secondary | ICD-10-CM | POA: Diagnosis not present

## 2016-08-23 DIAGNOSIS — M25551 Pain in right hip: Secondary | ICD-10-CM | POA: Diagnosis present

## 2016-08-23 DIAGNOSIS — R259 Unspecified abnormal involuntary movements: Secondary | ICD-10-CM | POA: Diagnosis not present

## 2016-08-23 DIAGNOSIS — R4182 Altered mental status, unspecified: Secondary | ICD-10-CM | POA: Diagnosis not present

## 2016-08-23 DIAGNOSIS — R296 Repeated falls: Secondary | ICD-10-CM | POA: Diagnosis not present

## 2016-08-23 DIAGNOSIS — W109XXD Fall (on) (from) unspecified stairs and steps, subsequent encounter: Secondary | ICD-10-CM | POA: Diagnosis not present

## 2016-08-29 DIAGNOSIS — G20 Parkinson's disease: Secondary | ICD-10-CM | POA: Diagnosis not present

## 2016-08-29 DIAGNOSIS — F419 Anxiety disorder, unspecified: Secondary | ICD-10-CM | POA: Diagnosis not present

## 2016-08-29 DIAGNOSIS — M6281 Muscle weakness (generalized): Secondary | ICD-10-CM | POA: Diagnosis not present

## 2016-08-29 DIAGNOSIS — W19XXXA Unspecified fall, initial encounter: Secondary | ICD-10-CM | POA: Diagnosis not present

## 2016-08-30 DIAGNOSIS — W19XXXA Unspecified fall, initial encounter: Secondary | ICD-10-CM | POA: Diagnosis not present

## 2016-08-30 DIAGNOSIS — M6281 Muscle weakness (generalized): Secondary | ICD-10-CM | POA: Diagnosis not present

## 2016-08-30 DIAGNOSIS — G20 Parkinson's disease: Secondary | ICD-10-CM | POA: Diagnosis not present

## 2016-08-30 DIAGNOSIS — F419 Anxiety disorder, unspecified: Secondary | ICD-10-CM | POA: Diagnosis not present

## 2016-08-31 DIAGNOSIS — G3183 Dementia with Lewy bodies: Secondary | ICD-10-CM | POA: Diagnosis not present

## 2016-08-31 DIAGNOSIS — F419 Anxiety disorder, unspecified: Secondary | ICD-10-CM | POA: Diagnosis not present

## 2016-08-31 DIAGNOSIS — F0280 Dementia in other diseases classified elsewhere without behavioral disturbance: Secondary | ICD-10-CM | POA: Diagnosis not present

## 2016-08-31 DIAGNOSIS — S2243XD Multiple fractures of ribs, bilateral, subsequent encounter for fracture with routine healing: Secondary | ICD-10-CM | POA: Diagnosis not present

## 2016-09-07 DIAGNOSIS — R296 Repeated falls: Secondary | ICD-10-CM | POA: Diagnosis not present

## 2016-09-07 DIAGNOSIS — W19XXXA Unspecified fall, initial encounter: Secondary | ICD-10-CM | POA: Diagnosis not present

## 2016-09-07 DIAGNOSIS — M6281 Muscle weakness (generalized): Secondary | ICD-10-CM | POA: Diagnosis not present

## 2016-09-07 DIAGNOSIS — F05 Delirium due to known physiological condition: Secondary | ICD-10-CM | POA: Diagnosis not present

## 2016-09-07 DIAGNOSIS — G20 Parkinson's disease: Secondary | ICD-10-CM | POA: Diagnosis not present

## 2016-09-07 DIAGNOSIS — F0390 Unspecified dementia without behavioral disturbance: Secondary | ICD-10-CM | POA: Diagnosis not present

## 2016-09-08 DIAGNOSIS — W109XXD Fall (on) (from) unspecified stairs and steps, subsequent encounter: Secondary | ICD-10-CM | POA: Diagnosis not present

## 2016-09-08 DIAGNOSIS — F419 Anxiety disorder, unspecified: Secondary | ICD-10-CM | POA: Diagnosis not present

## 2016-09-08 DIAGNOSIS — G20 Parkinson's disease: Secondary | ICD-10-CM | POA: Diagnosis not present

## 2016-09-10 ENCOUNTER — Emergency Department (HOSPITAL_COMMUNITY): Payer: Medicare Other

## 2016-09-10 ENCOUNTER — Emergency Department (HOSPITAL_COMMUNITY)
Admission: EM | Admit: 2016-09-10 | Discharge: 2016-09-10 | Disposition: A | Discharge status: Home / Self Care | Payer: Medicare Other | Attending: Emergency Medicine | Admitting: Emergency Medicine

## 2016-09-10 ENCOUNTER — Encounter (HOSPITAL_COMMUNITY): Payer: Self-pay | Admitting: *Deleted

## 2016-09-10 DIAGNOSIS — M25551 Pain in right hip: Secondary | ICD-10-CM | POA: Diagnosis not present

## 2016-09-10 DIAGNOSIS — Y939 Activity, unspecified: Secondary | ICD-10-CM | POA: Insufficient documentation

## 2016-09-10 DIAGNOSIS — Z79899 Other long term (current) drug therapy: Secondary | ICD-10-CM | POA: Insufficient documentation

## 2016-09-10 DIAGNOSIS — S79911A Unspecified injury of right hip, initial encounter: Secondary | ICD-10-CM | POA: Diagnosis not present

## 2016-09-10 DIAGNOSIS — W19XXXA Unspecified fall, initial encounter: Secondary | ICD-10-CM | POA: Insufficient documentation

## 2016-09-10 DIAGNOSIS — Z87891 Personal history of nicotine dependence: Secondary | ICD-10-CM | POA: Insufficient documentation

## 2016-09-10 DIAGNOSIS — Y999 Unspecified external cause status: Secondary | ICD-10-CM | POA: Insufficient documentation

## 2016-09-10 DIAGNOSIS — G20 Parkinson's disease: Secondary | ICD-10-CM | POA: Insufficient documentation

## 2016-09-10 DIAGNOSIS — Y929 Unspecified place or not applicable: Secondary | ICD-10-CM | POA: Insufficient documentation

## 2016-09-10 HISTORY — DX: Dysphagia, unspecified: R13.10

## 2016-09-10 HISTORY — DX: Anxiety disorder, unspecified: F41.9

## 2016-09-10 NOTE — ED Triage Notes (Signed)
Patient is coming from [facility].  Patient is being seen for a fall today.  Patient is delirium per staff.  Patient has complaint of right hip pain.  No LOC noted by witness of fall.

## 2016-09-10 NOTE — ED Provider Notes (Signed)
[facility]-EMERGENCY DEPT Provider Note   CSN: [id number] Arrival date & time: 09/10/16  0940     History   Chief Complaint Chief Complaint  Patient presents with  . Fall    HPI Larene PickettMarjorie B Sistrunk is a 70 y.o. female.  The history is provided by the patient and the nursing home.  Fall  This is a new problem. The current episode started 1 to 2 hours ago. The problem occurs constantly. The problem has not changed since onset.Pertinent negatives include no chest pain, no abdominal pain, no headaches and no shortness of breath. Nothing aggravates the symptoms. Nothing relieves the symptoms. She has tried nothing for the symptoms.    Past Medical History:  Diagnosis Date  . Anxiety   . Dysphagia   . Parkinson disease [facility](HCC)     Patient Active Problem List   Diagnosis Date Noted  . Pelvic pain in female 11/16/2014  . Chronic cystitis 11/16/2014  . 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 Cystitis 11/04/2014  . Parkinson disease [facility](HCC)     Past Surgical History:  Procedure Laterality Date  . gallbladder  09/2009  . 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 Authorizing Provider  carbidopa-levodopa (PARCOPA) 25-100 MG per disintegrating tablet Take 1 tablet by mouth 3 (three) times daily.    Historical Provider, MD  Cranberry 500 MG CHEW Chew by mouth daily.    Historical Provider, MD  Multiple Vitamin (MULTIVITAMIN) tablet Take 1 tablet by mouth daily.    Historical Provider, MD    Family History History reviewed. No pertinent family history.  Social History Social History  Substance Use Topics  . Smoking status: Former Games developermoker  . Smokeless tobacco: Never Used  . Alcohol use No     Allergies   Patient has no known allergies.   Review of Systems Review of Systems  Respiratory: Negative for shortness of breath.   Cardiovascular: Negative  for chest pain.  Gastrointestinal: Negative for abdominal pain.  Neurological: Negative for headaches.  All other systems reviewed and are negative.    Physical Exam Updated Vital Signs BP 138/78 (BP Location: Left Arm)   Pulse 68   Temp 98.2 F (36.8 C) (Oral)   Resp 18   SpO2 98%   Physical Exam  Constitutional: She is oriented to person, place, and time. She appears well-developed and well-nourished. No distress.  HENT:  Head: Normocephalic and atraumatic.  Nose: Nose normal.  Eyes: Conjunctivae are normal.  Neck: Neck supple. No tracheal deviation present.  Cardiovascular: Normal rate, regular rhythm and normal heart sounds.   Pulmonary/Chest: Effort normal and breath sounds normal. No respiratory distress. She exhibits no tenderness.  Abdominal: Soft. She exhibits no distension. There is no tenderness.  Musculoskeletal: She exhibits no tenderness or deformity.  Neurological: She is alert and oriented to person, place, and time.  Skin: Skin is warm and dry.  Psychiatric: She has a normal mood and affect.     ED Treatments / Results  Labs (all labs ordered are listed, but only abnormal results are displayed) Labs Reviewed - No data to display  EKG  EKG Interpretation None       Radiology Dg Hip Unilat W Or Wo Pelvis 2-3 Views Right  Result Date: 09/10/2016 CLINICAL DATA:  Fall.  Right hip pain. EXAM: DG HIP (  WITH OR WITHOUT PELVIS) 2-3V RIGHT COMPARISON:  None. FINDINGS: No pelvic fracture or diastasis. No right hip fracture or dislocation. No appreciable right hip arthropathy. No suspicious focal osseous lesions. Nonspecific coarse clustered calcifications in the left hemipelvis, which could represent small calcified uterine fibroids. IMPRESSION: No fracture. Electronically Signed   By: Delbert PhenixJason A Poff M.D.   On: 09/10/2016 10:40    Procedures Procedures (including critical care time)  Medications Ordered in ED Medications - No data to display   Initial  Impression / Assessment and Plan / ED Course  I have reviewed the triage vital signs and the nursing notes.  Pertinent labs & imaging results that were available during my care of the patient were reviewed by me and considered in my medical decision making (see chart for details).  Clinical Course    70 y.o. female presents with unwitnessed fall at nursing facility. No pain complaints now but initially complained of right hip pain. No evidence of head trauma. Plain film negative. Pt is at baseline per report. Plan to follow up with PCP as needed and return precautions discussed for worsening or new concerning symptoms.   Final Clinical Impressions(s) / ED Diagnoses   Final diagnoses:  Fall, initial encounter    New Prescriptions New Prescriptions   No medications on file     Lyndal Pulleyaniel Tramya Schoenfelder, MD 09/10/16 1911

## 2016-09-10 NOTE — ED Notes (Signed)
Bed: [id number] Expected date: 09/10/16 Expected time: 9:32 AM Means of arrival: Ambulance Comments: Fall

## 2016-09-10 NOTE — ED Notes (Signed)
Patient is alert and oriented to baseline.  sHe was given DC instructions and follow up visit instructions.  Patient dc instructions given to PTAR gave verbal understanding.  sHe was DC on stretcher to home.  V/S stable.  He was not showing any signs of distress on DC

## 2016-10-03 ENCOUNTER — Ambulatory Visit (INDEPENDENT_AMBULATORY_CARE_PROVIDER_SITE_OTHER): Payer: Medicare Other | Admitting: Physician Assistant

## 2016-10-03 ENCOUNTER — Encounter: Payer: Self-pay | Admitting: Physician Assistant

## 2016-10-03 ENCOUNTER — Ambulatory Visit (INDEPENDENT_AMBULATORY_CARE_PROVIDER_SITE_OTHER): Payer: Medicare Other

## 2016-10-03 VITALS — BP 105/61 | HR 89

## 2016-10-03 DIAGNOSIS — S4991XA Unspecified injury of right shoulder and upper arm, initial encounter: Secondary | ICD-10-CM | POA: Diagnosis not present

## 2016-10-03 DIAGNOSIS — R451 Restlessness and agitation: Secondary | ICD-10-CM

## 2016-10-03 DIAGNOSIS — M25511 Pain in right shoulder: Secondary | ICD-10-CM

## 2016-10-03 DIAGNOSIS — R296 Repeated falls: Secondary | ICD-10-CM

## 2016-10-03 DIAGNOSIS — G20A1 Parkinson's disease without dyskinesia, without mention of fluctuations: Secondary | ICD-10-CM

## 2016-10-03 DIAGNOSIS — M85811 Other specified disorders of bone density and structure, right shoulder: Secondary | ICD-10-CM

## 2016-10-03 DIAGNOSIS — G20 Parkinson's disease: Secondary | ICD-10-CM | POA: Diagnosis not present

## 2016-10-03 MED ORDER — DIAZEPAM 5 MG PO TABS: 5.0000 mg | ORAL_TABLET | Freq: Two times a day (BID) | ORAL | 1 refills | Status: AC | PRN

## 2016-10-03 NOTE — Patient Instructions (Addendum)
[place]   Adhesive Capsulitis Introduction Adhesive capsulitis is inflammation of the tendons and ligaments that surround the shoulder joint (shoulder capsule). This condition causes the shoulder to become stiff and painful to move. Adhesive capsulitis is also called frozen shoulder. What are the causes? This condition may be caused by:  An injury to the shoulder joint.  Straining the shoulder.  Not moving the shoulder for a period of time. This can happen if your arm was injured or in a sling.  Long-standing health problems, such as:  Diabetes.  Thyroid problems.  Heart disease.  Stroke.  Rheumatoid arthritis.  Lung disease. In some cases, the cause may not be known. What increases the risk? This condition is more likely to develop in:  Women.  People who are older than 69 years of age. What are the signs or symptoms? Symptoms of this condition include:  Pain in the shoulder when moving the arm. There may also be pain when parts of the shoulder are touched. The pain is worse at night or when at rest.  Soreness or aching in the shoulder.  Inability to move the shoulder normally.  Muscle spasms. How is this diagnosed? This condition is diagnosed with a physical exam and imaging tests, such as an X-ray or MRI. How is this treated? This condition may be treated with:  Treatment of the underlying cause or condition.  Physical therapy. This involves performing exercises to get the shoulder moving again.  Medicine. Medicine may be given to relieve pain, inflammation, or muscle spasms.  Steroid injections into the shoulder joint.  Shoulder manipulation. This is a procedure to move the shoulder into another position. It is done after you are given a medicine to make you fall asleep (general anesthetic). The joint may also be injected with salt water at high pressure to break down scarring.  Surgery. This may be done in severe cases when other treatments have  failed. Although most people recover completely from adhesive capsulitis, some may not regain the full movement of the shoulder. Follow these instructions at home:  Take over-the-counter and prescription medicines only as told by your health care provider.  If you are being treated with physical therapy, follow instructions from your physical therapist.  Avoid exercises that put a lot of demand on your shoulder, such as throwing. These exercises can make pain worse.  If directed, apply ice to the injured area:  Put ice in a plastic bag.  Place a towel between your skin and the bag.  Leave the ice on for 20 minutes, 2-3 times per day. Contact a health care provider if:  You develop new symptoms.  Your symptoms get worse. This information is not intended to replace advice given to you by your health care provider. Make sure you discuss any questions you have with your health care provider. Document Released: 07/16/2009 Document Revised: 02/24/2016 Document Reviewed: 01/11/2015  2017 [employer]

## 2016-10-03 NOTE — Progress Notes (Signed)
d 

## 2016-10-06 ENCOUNTER — Encounter: Payer: Self-pay | Admitting: Physician Assistant

## 2016-10-06 DIAGNOSIS — M25511 Pain in right shoulder: Secondary | ICD-10-CM | POA: Insufficient documentation

## 2016-10-06 NOTE — Progress Notes (Signed)
   Subjective:    Patient ID: Tara Reid, female    DOB: [birth year], 71 y.o.   MRN: [id number]  HPI  Pt is a 71 yo female with parkinsons disease that presents with her husband to discuss frequent falls. Per husband patient is falling 2-3 times a day. [facility] and placed in rehab from 11/5 to 11/23. 2 days ago she fell on right shoulder. Pt complains of pain and wants to have xrays to confirm nothing is fractured. Ibuprofen 200mg  does help with pain.   Husband complains that he feels like he is unable to take care of her and keep her from falling. When the sun goes down she gets really irritable. She sleeps about 4-6 hours a night. Pt is resistant to staying in wheelchair and bed. She continues to try to walk.    Review of Systems    see HPI.  Objective:   Physical Exam  Constitutional: She appears well-developed and well-nourished.  In wheelchair.   HENT:  Head: Normocephalic and atraumatic.  Cardiovascular: Normal rate, regular rhythm and normal heart sounds.   Pulmonary/Chest: Effort normal and breath sounds normal. She exhibits tenderness.  Tenderness over right lower ribs.   Musculoskeletal:  Right shoulder abduction to 90 degrees due to pain.  Tenderness over acromion to palpation.    Neurological: She is alert. Coordination abnormal.  Pt was oriented to place but not time.    Psychiatric:  Very tearful and upset when talking about a nursing home.           Assessment & Plan:  Marland Kitchen.Marland Kitchen.Diagnoses and all orders for this visit:  Acute pain of right shoulder -     DG Shoulder Right; Future  Parkinson disease (HCC) -     diazepam (VALIUM) 5 MG tablet; Take 1 tablet (5 mg total) by mouth every 12 (twelve) hours as needed for anxiety.  Frequent falls  Agitation -     diazepam (VALIUM) 5 MG tablet; Take 1 tablet (5 mg total) by mouth every 12 (twelve) hours as needed for anxiety.   Discussed filling out paperwork to consider a nursing facility for  patient. I do think this would be a great idea.   Xray confirm no fracture. I do think there is some adhesive capsulitis due to inability to get full ROM. Given handouts. Continue NSAIDs. Ice as needed.   Consider valium for agitation.

## 2016-10-23 DIAGNOSIS — G20 Parkinson's disease: Secondary | ICD-10-CM | POA: Diagnosis not present

## 2016-10-23 DIAGNOSIS — R51 Headache: Secondary | ICD-10-CM | POA: Diagnosis not present

## 2016-10-23 DIAGNOSIS — S0990XA Unspecified injury of head, initial encounter: Secondary | ICD-10-CM | POA: Diagnosis not present

## 2016-10-23 DIAGNOSIS — M47812 Spondylosis without myelopathy or radiculopathy, cervical region: Secondary | ICD-10-CM | POA: Diagnosis not present

## 2016-10-23 DIAGNOSIS — S0101XA Laceration without foreign body of scalp, initial encounter: Secondary | ICD-10-CM | POA: Diagnosis not present

## 2016-10-23 DIAGNOSIS — Z79899 Other long term (current) drug therapy: Secondary | ICD-10-CM | POA: Diagnosis not present

## 2016-10-23 DIAGNOSIS — R296 Repeated falls: Secondary | ICD-10-CM | POA: Diagnosis not present

## 2016-10-24 DIAGNOSIS — M47812 Spondylosis without myelopathy or radiculopathy, cervical region: Secondary | ICD-10-CM | POA: Diagnosis not present

## 2016-10-24 DIAGNOSIS — S0990XA Unspecified injury of head, initial encounter: Secondary | ICD-10-CM | POA: Diagnosis not present

## 2016-10-26 ENCOUNTER — Telehealth: Payer: Self-pay | Admitting: Physician Assistant

## 2016-10-26 NOTE — Telephone Encounter (Signed)
Raiford NobleRick called clinic today to get an update on what skilled nursing facility she chose for the Patient to go. Raiford NobleRick states PCP was going to decide what facility. Will route for review.

## 2016-10-31 NOTE — Telephone Encounter (Signed)
Please call pt. Let him know up to him and insurance on what is best approved. Make sure he has formed will filled out in office.

## 2016-11-13 ENCOUNTER — Other Ambulatory Visit: Payer: Self-pay | Admitting: Family Medicine

## 2016-11-13 DIAGNOSIS — G20 Parkinson's disease: Secondary | ICD-10-CM

## 2016-11-13 DIAGNOSIS — R451 Restlessness and agitation: Secondary | ICD-10-CM

## 2016-11-20 ENCOUNTER — Other Ambulatory Visit: Payer: Self-pay | Admitting: Family Medicine

## 2016-11-20 DIAGNOSIS — R451 Restlessness and agitation: Secondary | ICD-10-CM

## 2016-11-20 DIAGNOSIS — G20 Parkinson's disease: Secondary | ICD-10-CM

## 2016-11-30 DIAGNOSIS — R251 Tremor, unspecified: Secondary | ICD-10-CM | POA: Diagnosis not present

## 2016-11-30 DIAGNOSIS — F419 Anxiety disorder, unspecified: Secondary | ICD-10-CM | POA: Diagnosis not present

## 2016-11-30 DIAGNOSIS — R443 Hallucinations, unspecified: Secondary | ICD-10-CM | POA: Diagnosis not present

## 2016-11-30 DIAGNOSIS — G20 Parkinson's disease: Secondary | ICD-10-CM | POA: Diagnosis not present

## 2016-12-28 DIAGNOSIS — Z79899 Other long term (current) drug therapy: Secondary | ICD-10-CM | POA: Diagnosis not present

## 2016-12-28 DIAGNOSIS — R262 Difficulty in walking, not elsewhere classified: Secondary | ICD-10-CM | POA: Diagnosis not present

## 2016-12-28 DIAGNOSIS — S32050A Wedge compression fracture of fifth lumbar vertebra, initial encounter for closed fracture: Secondary | ICD-10-CM | POA: Diagnosis not present

## 2016-12-28 DIAGNOSIS — W19XXXA Unspecified fall, initial encounter: Secondary | ICD-10-CM | POA: Diagnosis not present

## 2016-12-28 DIAGNOSIS — S32049A Unspecified fracture of fourth lumbar vertebra, initial encounter for closed fracture: Secondary | ICD-10-CM | POA: Diagnosis not present

## 2016-12-28 DIAGNOSIS — S32029A Unspecified fracture of second lumbar vertebra, initial encounter for closed fracture: Secondary | ICD-10-CM | POA: Diagnosis not present

## 2016-12-28 DIAGNOSIS — S32059A Unspecified fracture of fifth lumbar vertebra, initial encounter for closed fracture: Secondary | ICD-10-CM | POA: Diagnosis not present

## 2016-12-28 DIAGNOSIS — S3992XA Unspecified injury of lower back, initial encounter: Secondary | ICD-10-CM | POA: Diagnosis not present

## 2016-12-28 DIAGNOSIS — S79911A Unspecified injury of right hip, initial encounter: Secondary | ICD-10-CM | POA: Diagnosis not present

## 2016-12-28 DIAGNOSIS — S32040A Wedge compression fracture of fourth lumbar vertebra, initial encounter for closed fracture: Secondary | ICD-10-CM | POA: Diagnosis not present

## 2016-12-28 DIAGNOSIS — G20 Parkinson's disease: Secondary | ICD-10-CM | POA: Diagnosis not present

## 2016-12-28 DIAGNOSIS — S32020A Wedge compression fracture of second lumbar vertebra, initial encounter for closed fracture: Secondary | ICD-10-CM | POA: Diagnosis not present

## 2016-12-28 DIAGNOSIS — S79912A Unspecified injury of left hip, initial encounter: Secondary | ICD-10-CM | POA: Diagnosis not present

## 2016-12-28 DIAGNOSIS — R102 Pelvic and perineal pain: Secondary | ICD-10-CM | POA: Diagnosis not present

## 2017-01-05 IMAGING — CR DG HIP (WITH OR WITHOUT PELVIS) 2-3V*R*
3 series · 3 of 3 positions shown · non-contrast
Comparison: None.

CLINICAL DATA: Fall.  Right hip pain.

EXAM:
DG HIP (WITH OR WITHOUT PELVIS) 2-3V RIGHT

[t pelvis ap]
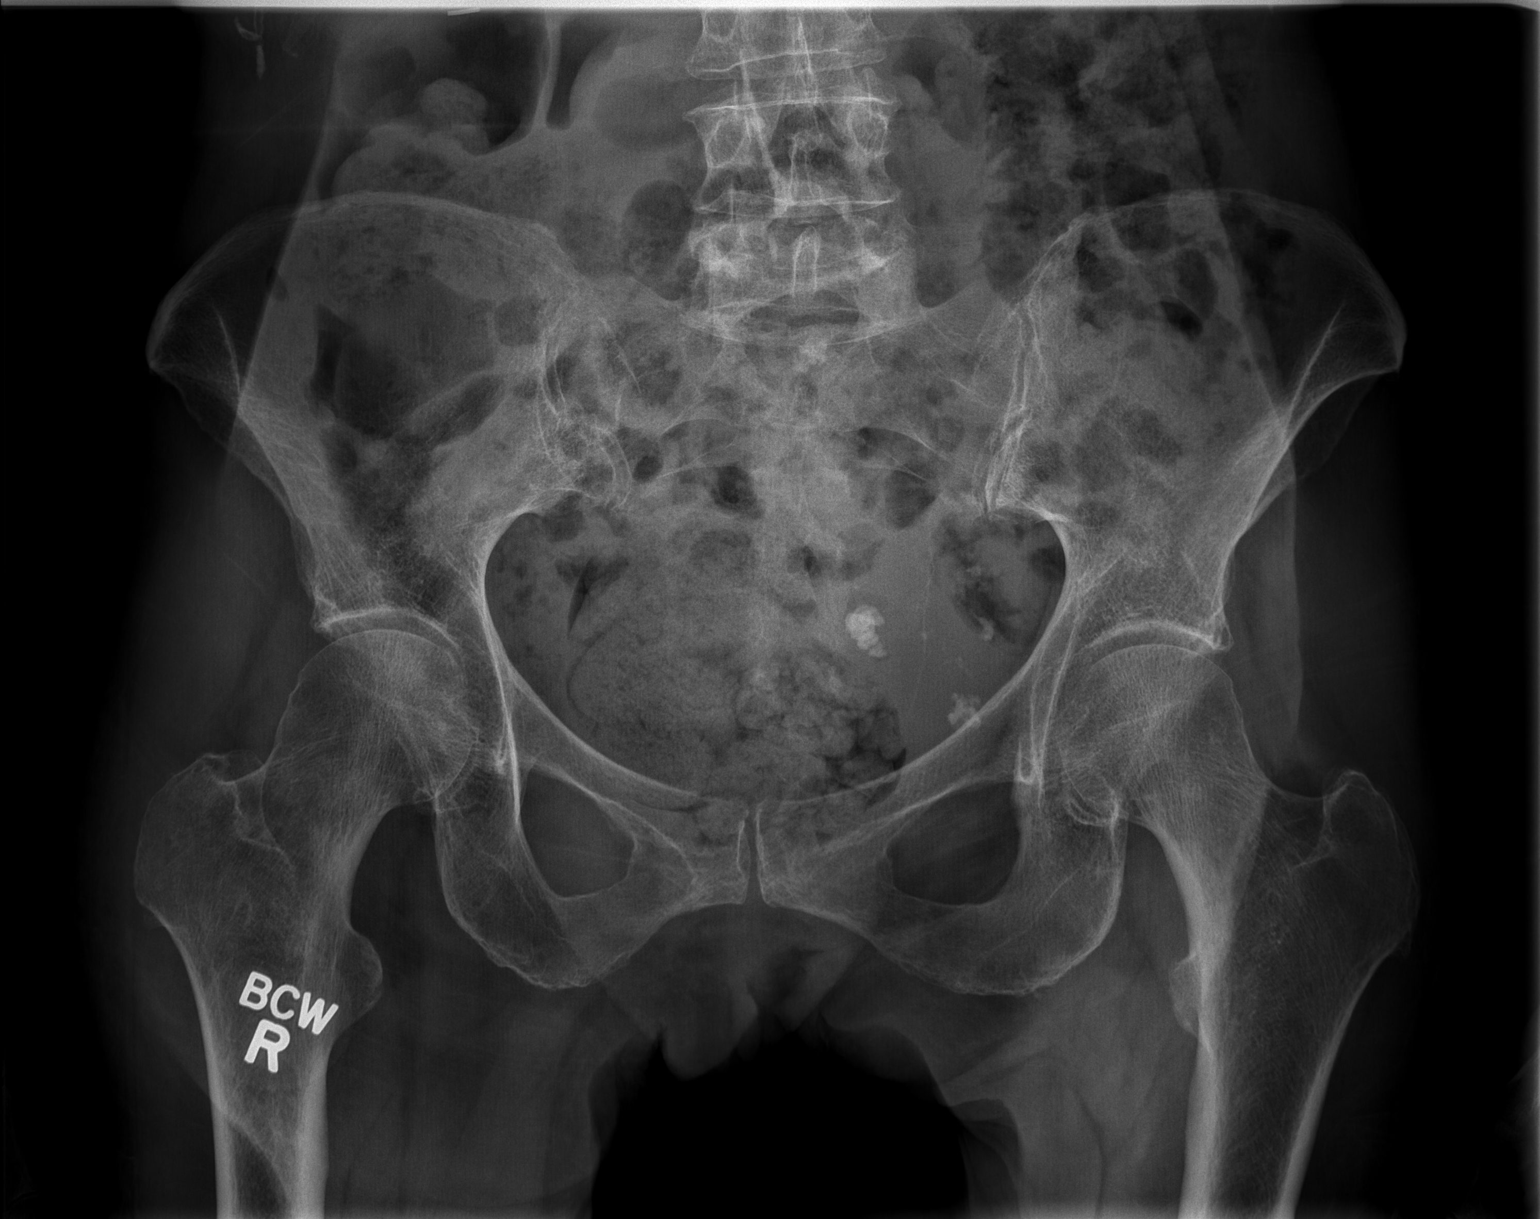

[t hip ap right]
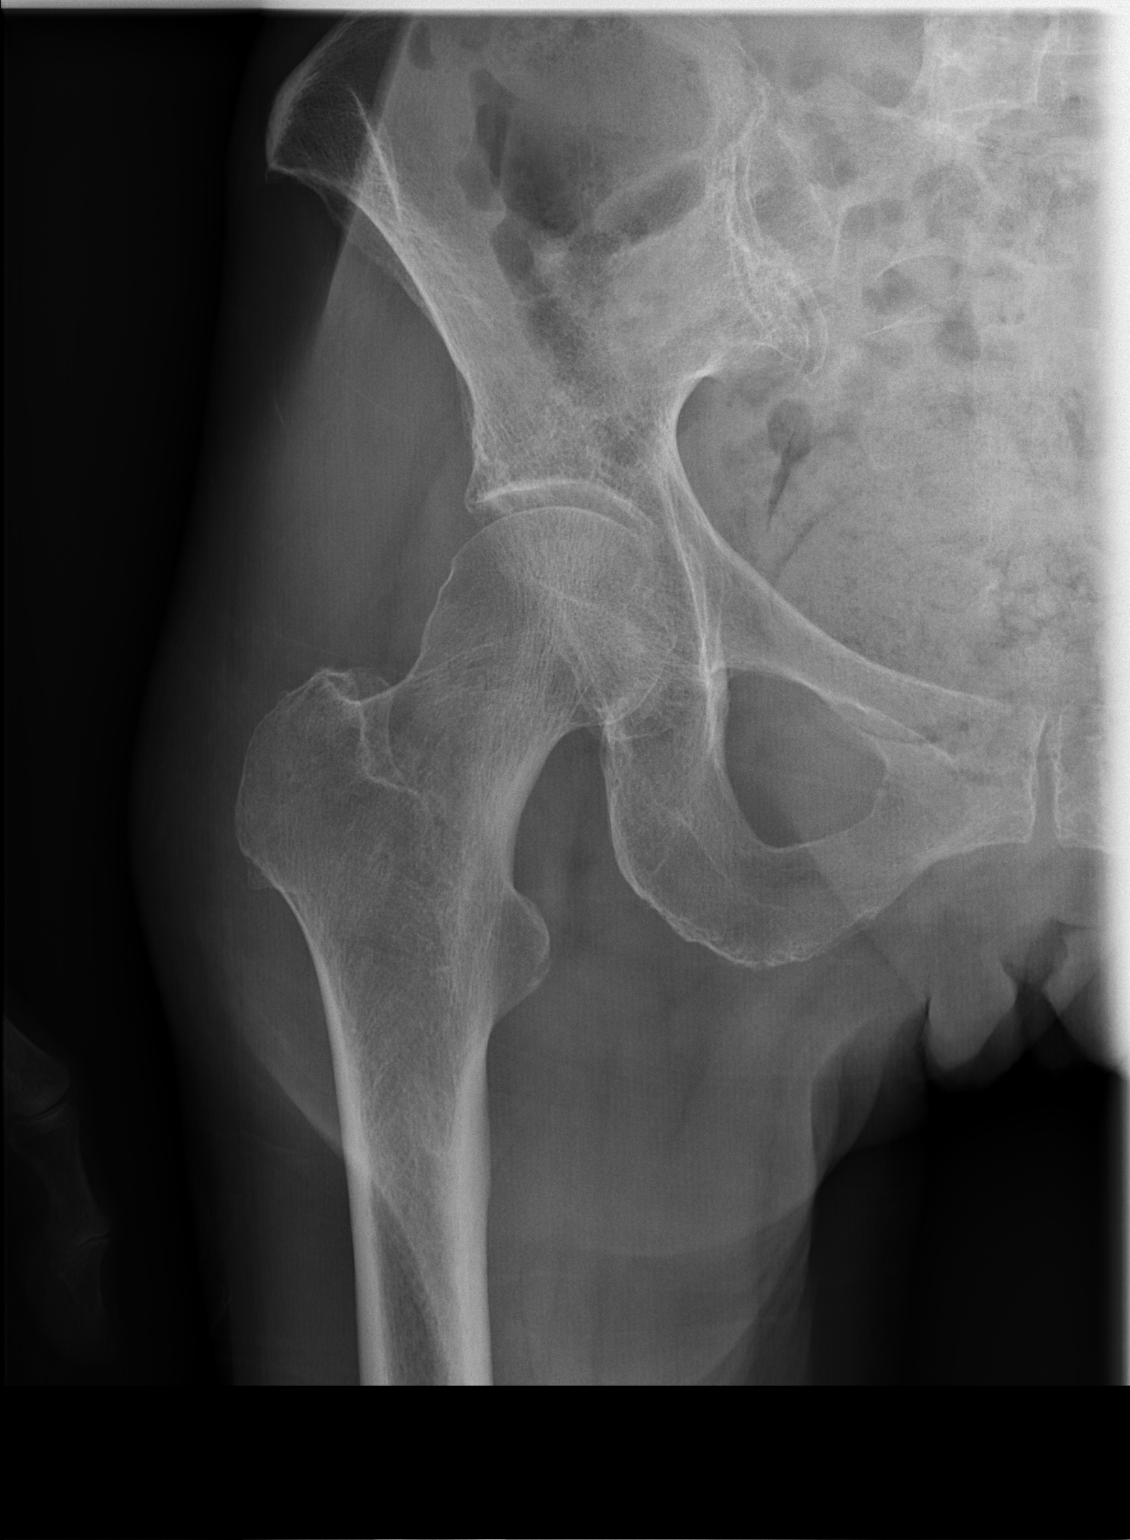

[t hip frog leg right]
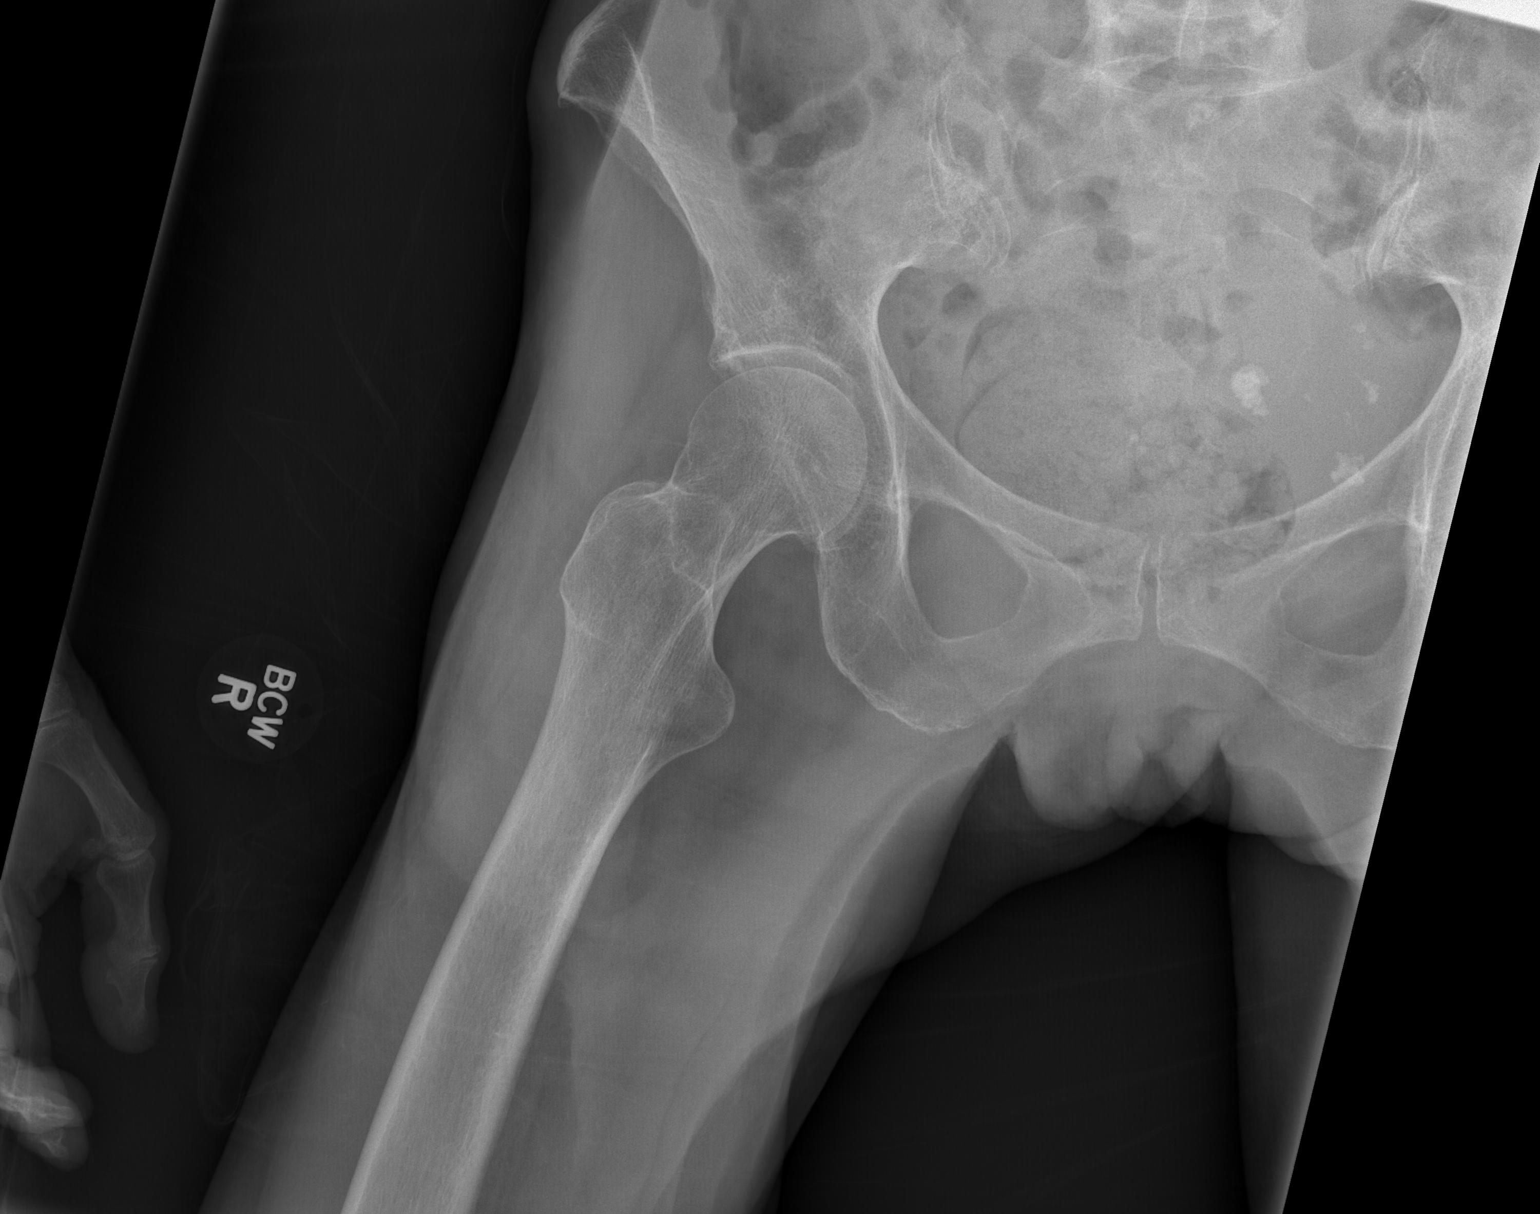

[3 of 3 positions shown; findings below may reference images not displayed]

FINDINGS: No pelvic fracture or diastasis. No right hip fracture or
dislocation. No appreciable right hip arthropathy. No suspicious
focal osseous lesions. Nonspecific coarse clustered calcifications
in the left hemipelvis, which could represent small calcified
uterine fibroids.
IMPRESSION: No fracture.

## 2017-05-10 ENCOUNTER — Telehealth: Payer: Self-pay

## 2017-05-11 NOTE — Telephone Encounter (Signed)
error 

## 2017-07-03 DIAGNOSIS — R296 Repeated falls: Secondary | ICD-10-CM | POA: Diagnosis not present

## 2017-07-03 DIAGNOSIS — Z741 Need for assistance with personal care: Secondary | ICD-10-CM | POA: Diagnosis not present

## 2017-07-03 DIAGNOSIS — Z743 Need for continuous supervision: Secondary | ICD-10-CM | POA: Diagnosis not present

## 2017-07-03 DIAGNOSIS — F419 Anxiety disorder, unspecified: Secondary | ICD-10-CM | POA: Diagnosis not present

## 2017-07-03 DIAGNOSIS — Z66 Do not resuscitate: Secondary | ICD-10-CM | POA: Diagnosis not present

## 2017-07-03 DIAGNOSIS — G20 Parkinson's disease: Secondary | ICD-10-CM | POA: Diagnosis not present

## 2017-07-03 DIAGNOSIS — R471 Dysarthria and anarthria: Secondary | ICD-10-CM | POA: Diagnosis not present

## 2017-07-03 DIAGNOSIS — R260 Ataxic gait: Secondary | ICD-10-CM | POA: Diagnosis not present

## 2017-07-03 DIAGNOSIS — R451 Restlessness and agitation: Secondary | ICD-10-CM | POA: Diagnosis not present

## 2017-07-03 DIAGNOSIS — R634 Abnormal weight loss: Secondary | ICD-10-CM | POA: Diagnosis not present

## 2017-07-03 DIAGNOSIS — R531 Weakness: Secondary | ICD-10-CM | POA: Diagnosis not present

## 2017-07-04 DIAGNOSIS — G20 Parkinson's disease: Secondary | ICD-10-CM | POA: Diagnosis not present

## 2017-07-04 DIAGNOSIS — R531 Weakness: Secondary | ICD-10-CM | POA: Diagnosis not present

## 2017-07-04 DIAGNOSIS — R296 Repeated falls: Secondary | ICD-10-CM | POA: Diagnosis not present

## 2017-07-04 DIAGNOSIS — R260 Ataxic gait: Secondary | ICD-10-CM | POA: Diagnosis not present

## 2017-07-04 DIAGNOSIS — R451 Restlessness and agitation: Secondary | ICD-10-CM | POA: Diagnosis not present

## 2017-07-04 DIAGNOSIS — F419 Anxiety disorder, unspecified: Secondary | ICD-10-CM | POA: Diagnosis not present

## 2017-07-06 DIAGNOSIS — F419 Anxiety disorder, unspecified: Secondary | ICD-10-CM | POA: Diagnosis not present

## 2017-07-06 DIAGNOSIS — G20 Parkinson's disease: Secondary | ICD-10-CM | POA: Diagnosis not present

## 2017-07-06 DIAGNOSIS — R451 Restlessness and agitation: Secondary | ICD-10-CM | POA: Diagnosis not present

## 2017-07-06 DIAGNOSIS — R531 Weakness: Secondary | ICD-10-CM | POA: Diagnosis not present

## 2017-07-06 DIAGNOSIS — R296 Repeated falls: Secondary | ICD-10-CM | POA: Diagnosis not present

## 2017-07-06 DIAGNOSIS — R260 Ataxic gait: Secondary | ICD-10-CM | POA: Diagnosis not present

## 2017-07-09 DIAGNOSIS — F419 Anxiety disorder, unspecified: Secondary | ICD-10-CM | POA: Diagnosis not present

## 2017-07-09 DIAGNOSIS — R260 Ataxic gait: Secondary | ICD-10-CM | POA: Diagnosis not present

## 2017-07-09 DIAGNOSIS — R296 Repeated falls: Secondary | ICD-10-CM | POA: Diagnosis not present

## 2017-07-09 DIAGNOSIS — G20 Parkinson's disease: Secondary | ICD-10-CM | POA: Diagnosis not present

## 2017-07-09 DIAGNOSIS — R451 Restlessness and agitation: Secondary | ICD-10-CM | POA: Diagnosis not present

## 2017-07-09 DIAGNOSIS — R531 Weakness: Secondary | ICD-10-CM | POA: Diagnosis not present

## 2017-07-10 DIAGNOSIS — R531 Weakness: Secondary | ICD-10-CM | POA: Diagnosis not present

## 2017-07-10 DIAGNOSIS — R296 Repeated falls: Secondary | ICD-10-CM | POA: Diagnosis not present

## 2017-07-10 DIAGNOSIS — R260 Ataxic gait: Secondary | ICD-10-CM | POA: Diagnosis not present

## 2017-07-10 DIAGNOSIS — R451 Restlessness and agitation: Secondary | ICD-10-CM | POA: Diagnosis not present

## 2017-07-10 DIAGNOSIS — F419 Anxiety disorder, unspecified: Secondary | ICD-10-CM | POA: Diagnosis not present

## 2017-07-10 DIAGNOSIS — G20 Parkinson's disease: Secondary | ICD-10-CM | POA: Diagnosis not present

## 2017-07-11 DIAGNOSIS — R531 Weakness: Secondary | ICD-10-CM | POA: Diagnosis not present

## 2017-07-11 DIAGNOSIS — R451 Restlessness and agitation: Secondary | ICD-10-CM | POA: Diagnosis not present

## 2017-07-11 DIAGNOSIS — F419 Anxiety disorder, unspecified: Secondary | ICD-10-CM | POA: Diagnosis not present

## 2017-07-11 DIAGNOSIS — R296 Repeated falls: Secondary | ICD-10-CM | POA: Diagnosis not present

## 2017-07-11 DIAGNOSIS — R260 Ataxic gait: Secondary | ICD-10-CM | POA: Diagnosis not present

## 2017-07-11 DIAGNOSIS — G20 Parkinson's disease: Secondary | ICD-10-CM | POA: Diagnosis not present

## 2017-07-13 DIAGNOSIS — R296 Repeated falls: Secondary | ICD-10-CM | POA: Diagnosis not present

## 2017-07-13 DIAGNOSIS — R260 Ataxic gait: Secondary | ICD-10-CM | POA: Diagnosis not present

## 2017-07-13 DIAGNOSIS — R531 Weakness: Secondary | ICD-10-CM | POA: Diagnosis not present

## 2017-07-13 DIAGNOSIS — G20 Parkinson's disease: Secondary | ICD-10-CM | POA: Diagnosis not present

## 2017-07-13 DIAGNOSIS — R451 Restlessness and agitation: Secondary | ICD-10-CM | POA: Diagnosis not present

## 2017-07-13 DIAGNOSIS — F419 Anxiety disorder, unspecified: Secondary | ICD-10-CM | POA: Diagnosis not present

## 2017-07-16 DIAGNOSIS — F419 Anxiety disorder, unspecified: Secondary | ICD-10-CM | POA: Diagnosis not present

## 2017-07-16 DIAGNOSIS — R260 Ataxic gait: Secondary | ICD-10-CM | POA: Diagnosis not present

## 2017-07-16 DIAGNOSIS — G20 Parkinson's disease: Secondary | ICD-10-CM | POA: Diagnosis not present

## 2017-07-16 DIAGNOSIS — R451 Restlessness and agitation: Secondary | ICD-10-CM | POA: Diagnosis not present

## 2017-07-16 DIAGNOSIS — R296 Repeated falls: Secondary | ICD-10-CM | POA: Diagnosis not present

## 2017-07-16 DIAGNOSIS — R531 Weakness: Secondary | ICD-10-CM | POA: Diagnosis not present

## 2017-07-17 DIAGNOSIS — R451 Restlessness and agitation: Secondary | ICD-10-CM | POA: Diagnosis not present

## 2017-07-17 DIAGNOSIS — R296 Repeated falls: Secondary | ICD-10-CM | POA: Diagnosis not present

## 2017-07-17 DIAGNOSIS — G20 Parkinson's disease: Secondary | ICD-10-CM | POA: Diagnosis not present

## 2017-07-17 DIAGNOSIS — R260 Ataxic gait: Secondary | ICD-10-CM | POA: Diagnosis not present

## 2017-07-17 DIAGNOSIS — F419 Anxiety disorder, unspecified: Secondary | ICD-10-CM | POA: Diagnosis not present

## 2017-07-17 DIAGNOSIS — R531 Weakness: Secondary | ICD-10-CM | POA: Diagnosis not present

## 2017-07-18 DIAGNOSIS — R451 Restlessness and agitation: Secondary | ICD-10-CM | POA: Diagnosis not present

## 2017-07-18 DIAGNOSIS — R260 Ataxic gait: Secondary | ICD-10-CM | POA: Diagnosis not present

## 2017-07-18 DIAGNOSIS — F419 Anxiety disorder, unspecified: Secondary | ICD-10-CM | POA: Diagnosis not present

## 2017-07-18 DIAGNOSIS — G20 Parkinson's disease: Secondary | ICD-10-CM | POA: Diagnosis not present

## 2017-07-18 DIAGNOSIS — R296 Repeated falls: Secondary | ICD-10-CM | POA: Diagnosis not present

## 2017-07-18 DIAGNOSIS — R531 Weakness: Secondary | ICD-10-CM | POA: Diagnosis not present

## 2017-07-19 DIAGNOSIS — R451 Restlessness and agitation: Secondary | ICD-10-CM | POA: Diagnosis not present

## 2017-07-19 DIAGNOSIS — R531 Weakness: Secondary | ICD-10-CM | POA: Diagnosis not present

## 2017-07-19 DIAGNOSIS — G20 Parkinson's disease: Secondary | ICD-10-CM | POA: Diagnosis not present

## 2017-07-19 DIAGNOSIS — R260 Ataxic gait: Secondary | ICD-10-CM | POA: Diagnosis not present

## 2017-07-19 DIAGNOSIS — R296 Repeated falls: Secondary | ICD-10-CM | POA: Diagnosis not present

## 2017-07-19 DIAGNOSIS — F419 Anxiety disorder, unspecified: Secondary | ICD-10-CM | POA: Diagnosis not present

## 2017-07-20 DIAGNOSIS — R260 Ataxic gait: Secondary | ICD-10-CM | POA: Diagnosis not present

## 2017-07-20 DIAGNOSIS — F419 Anxiety disorder, unspecified: Secondary | ICD-10-CM | POA: Diagnosis not present

## 2017-07-20 DIAGNOSIS — R296 Repeated falls: Secondary | ICD-10-CM | POA: Diagnosis not present

## 2017-07-20 DIAGNOSIS — G20 Parkinson's disease: Secondary | ICD-10-CM | POA: Diagnosis not present

## 2017-07-20 DIAGNOSIS — R531 Weakness: Secondary | ICD-10-CM | POA: Diagnosis not present

## 2017-07-20 DIAGNOSIS — R451 Restlessness and agitation: Secondary | ICD-10-CM | POA: Diagnosis not present

## 2017-07-23 DIAGNOSIS — R260 Ataxic gait: Secondary | ICD-10-CM | POA: Diagnosis not present

## 2017-07-23 DIAGNOSIS — R531 Weakness: Secondary | ICD-10-CM | POA: Diagnosis not present

## 2017-07-23 DIAGNOSIS — R451 Restlessness and agitation: Secondary | ICD-10-CM | POA: Diagnosis not present

## 2017-07-23 DIAGNOSIS — F419 Anxiety disorder, unspecified: Secondary | ICD-10-CM | POA: Diagnosis not present

## 2017-07-23 DIAGNOSIS — R296 Repeated falls: Secondary | ICD-10-CM | POA: Diagnosis not present

## 2017-07-23 DIAGNOSIS — G20 Parkinson's disease: Secondary | ICD-10-CM | POA: Diagnosis not present

## 2017-07-24 DIAGNOSIS — F419 Anxiety disorder, unspecified: Secondary | ICD-10-CM | POA: Diagnosis not present

## 2017-07-24 DIAGNOSIS — R451 Restlessness and agitation: Secondary | ICD-10-CM | POA: Diagnosis not present

## 2017-07-24 DIAGNOSIS — R260 Ataxic gait: Secondary | ICD-10-CM | POA: Diagnosis not present

## 2017-07-24 DIAGNOSIS — R531 Weakness: Secondary | ICD-10-CM | POA: Diagnosis not present

## 2017-07-24 DIAGNOSIS — G20 Parkinson's disease: Secondary | ICD-10-CM | POA: Diagnosis not present

## 2017-07-24 DIAGNOSIS — R296 Repeated falls: Secondary | ICD-10-CM | POA: Diagnosis not present

## 2017-07-25 DIAGNOSIS — R260 Ataxic gait: Secondary | ICD-10-CM | POA: Diagnosis not present

## 2017-07-25 DIAGNOSIS — R451 Restlessness and agitation: Secondary | ICD-10-CM | POA: Diagnosis not present

## 2017-07-25 DIAGNOSIS — F419 Anxiety disorder, unspecified: Secondary | ICD-10-CM | POA: Diagnosis not present

## 2017-07-25 DIAGNOSIS — G20 Parkinson's disease: Secondary | ICD-10-CM | POA: Diagnosis not present

## 2017-07-25 DIAGNOSIS — R296 Repeated falls: Secondary | ICD-10-CM | POA: Diagnosis not present

## 2017-07-25 DIAGNOSIS — R531 Weakness: Secondary | ICD-10-CM | POA: Diagnosis not present

## 2017-07-26 DIAGNOSIS — G20 Parkinson's disease: Secondary | ICD-10-CM | POA: Diagnosis not present

## 2017-07-26 DIAGNOSIS — F0281 Dementia in other diseases classified elsewhere with behavioral disturbance: Secondary | ICD-10-CM | POA: Diagnosis not present

## 2017-07-27 DIAGNOSIS — R296 Repeated falls: Secondary | ICD-10-CM | POA: Diagnosis not present

## 2017-07-27 DIAGNOSIS — G20 Parkinson's disease: Secondary | ICD-10-CM | POA: Diagnosis not present

## 2017-07-27 DIAGNOSIS — F419 Anxiety disorder, unspecified: Secondary | ICD-10-CM | POA: Diagnosis not present

## 2017-07-27 DIAGNOSIS — R531 Weakness: Secondary | ICD-10-CM | POA: Diagnosis not present

## 2017-07-27 DIAGNOSIS — R260 Ataxic gait: Secondary | ICD-10-CM | POA: Diagnosis not present

## 2017-07-27 DIAGNOSIS — R451 Restlessness and agitation: Secondary | ICD-10-CM | POA: Diagnosis not present

## 2017-07-30 DIAGNOSIS — R260 Ataxic gait: Secondary | ICD-10-CM | POA: Diagnosis not present

## 2017-07-30 DIAGNOSIS — R296 Repeated falls: Secondary | ICD-10-CM | POA: Diagnosis not present

## 2017-07-30 DIAGNOSIS — F419 Anxiety disorder, unspecified: Secondary | ICD-10-CM | POA: Diagnosis not present

## 2017-07-30 DIAGNOSIS — R451 Restlessness and agitation: Secondary | ICD-10-CM | POA: Diagnosis not present

## 2017-07-30 DIAGNOSIS — R531 Weakness: Secondary | ICD-10-CM | POA: Diagnosis not present

## 2017-07-30 DIAGNOSIS — G20 Parkinson's disease: Secondary | ICD-10-CM | POA: Diagnosis not present

## 2017-07-31 DIAGNOSIS — R451 Restlessness and agitation: Secondary | ICD-10-CM | POA: Diagnosis not present

## 2017-07-31 DIAGNOSIS — R531 Weakness: Secondary | ICD-10-CM | POA: Diagnosis not present

## 2017-07-31 DIAGNOSIS — R260 Ataxic gait: Secondary | ICD-10-CM | POA: Diagnosis not present

## 2017-07-31 DIAGNOSIS — R296 Repeated falls: Secondary | ICD-10-CM | POA: Diagnosis not present

## 2017-07-31 DIAGNOSIS — G20 Parkinson's disease: Secondary | ICD-10-CM | POA: Diagnosis not present

## 2017-07-31 DIAGNOSIS — F419 Anxiety disorder, unspecified: Secondary | ICD-10-CM | POA: Diagnosis not present

## 2017-08-01 DIAGNOSIS — F419 Anxiety disorder, unspecified: Secondary | ICD-10-CM | POA: Diagnosis not present

## 2017-08-01 DIAGNOSIS — R296 Repeated falls: Secondary | ICD-10-CM | POA: Diagnosis not present

## 2017-08-01 DIAGNOSIS — R531 Weakness: Secondary | ICD-10-CM | POA: Diagnosis not present

## 2017-08-01 DIAGNOSIS — G20 Parkinson's disease: Secondary | ICD-10-CM | POA: Diagnosis not present

## 2017-08-01 DIAGNOSIS — R260 Ataxic gait: Secondary | ICD-10-CM | POA: Diagnosis not present

## 2017-08-01 DIAGNOSIS — R451 Restlessness and agitation: Secondary | ICD-10-CM | POA: Diagnosis not present

## 2017-08-02 DIAGNOSIS — Z743 Need for continuous supervision: Secondary | ICD-10-CM | POA: Diagnosis not present

## 2017-08-02 DIAGNOSIS — R451 Restlessness and agitation: Secondary | ICD-10-CM | POA: Diagnosis not present

## 2017-08-02 DIAGNOSIS — Z66 Do not resuscitate: Secondary | ICD-10-CM | POA: Diagnosis not present

## 2017-08-02 DIAGNOSIS — R634 Abnormal weight loss: Secondary | ICD-10-CM | POA: Diagnosis not present

## 2017-08-02 DIAGNOSIS — R260 Ataxic gait: Secondary | ICD-10-CM | POA: Diagnosis not present

## 2017-08-02 DIAGNOSIS — Z741 Need for assistance with personal care: Secondary | ICD-10-CM | POA: Diagnosis not present

## 2017-08-02 DIAGNOSIS — R296 Repeated falls: Secondary | ICD-10-CM | POA: Diagnosis not present

## 2017-08-02 DIAGNOSIS — G20 Parkinson's disease: Secondary | ICD-10-CM | POA: Diagnosis not present

## 2017-08-02 DIAGNOSIS — R531 Weakness: Secondary | ICD-10-CM | POA: Diagnosis not present

## 2017-08-02 DIAGNOSIS — R471 Dysarthria and anarthria: Secondary | ICD-10-CM | POA: Diagnosis not present

## 2017-08-02 DIAGNOSIS — F419 Anxiety disorder, unspecified: Secondary | ICD-10-CM | POA: Diagnosis not present

## 2017-08-03 DIAGNOSIS — F419 Anxiety disorder, unspecified: Secondary | ICD-10-CM | POA: Diagnosis not present

## 2017-08-03 DIAGNOSIS — R296 Repeated falls: Secondary | ICD-10-CM | POA: Diagnosis not present

## 2017-08-03 DIAGNOSIS — R260 Ataxic gait: Secondary | ICD-10-CM | POA: Diagnosis not present

## 2017-08-03 DIAGNOSIS — R451 Restlessness and agitation: Secondary | ICD-10-CM | POA: Diagnosis not present

## 2017-08-03 DIAGNOSIS — G20 Parkinson's disease: Secondary | ICD-10-CM | POA: Diagnosis not present

## 2017-08-03 DIAGNOSIS — R531 Weakness: Secondary | ICD-10-CM | POA: Diagnosis not present

## 2017-08-06 DIAGNOSIS — F419 Anxiety disorder, unspecified: Secondary | ICD-10-CM | POA: Diagnosis not present

## 2017-08-06 DIAGNOSIS — R451 Restlessness and agitation: Secondary | ICD-10-CM | POA: Diagnosis not present

## 2017-08-06 DIAGNOSIS — R531 Weakness: Secondary | ICD-10-CM | POA: Diagnosis not present

## 2017-08-06 DIAGNOSIS — G20 Parkinson's disease: Secondary | ICD-10-CM | POA: Diagnosis not present

## 2017-08-06 DIAGNOSIS — R260 Ataxic gait: Secondary | ICD-10-CM | POA: Diagnosis not present

## 2017-08-06 DIAGNOSIS — R296 Repeated falls: Secondary | ICD-10-CM | POA: Diagnosis not present

## 2017-08-08 DIAGNOSIS — R451 Restlessness and agitation: Secondary | ICD-10-CM | POA: Diagnosis not present

## 2017-08-08 DIAGNOSIS — R260 Ataxic gait: Secondary | ICD-10-CM | POA: Diagnosis not present

## 2017-08-08 DIAGNOSIS — G20 Parkinson's disease: Secondary | ICD-10-CM | POA: Diagnosis not present

## 2017-08-08 DIAGNOSIS — F419 Anxiety disorder, unspecified: Secondary | ICD-10-CM | POA: Diagnosis not present

## 2017-08-08 DIAGNOSIS — R531 Weakness: Secondary | ICD-10-CM | POA: Diagnosis not present

## 2017-08-08 DIAGNOSIS — R296 Repeated falls: Secondary | ICD-10-CM | POA: Diagnosis not present

## 2017-08-13 DIAGNOSIS — F419 Anxiety disorder, unspecified: Secondary | ICD-10-CM | POA: Diagnosis not present

## 2017-08-13 DIAGNOSIS — R260 Ataxic gait: Secondary | ICD-10-CM | POA: Diagnosis not present

## 2017-08-13 DIAGNOSIS — R451 Restlessness and agitation: Secondary | ICD-10-CM | POA: Diagnosis not present

## 2017-08-13 DIAGNOSIS — G20 Parkinson's disease: Secondary | ICD-10-CM | POA: Diagnosis not present

## 2017-08-13 DIAGNOSIS — R296 Repeated falls: Secondary | ICD-10-CM | POA: Diagnosis not present

## 2017-08-13 DIAGNOSIS — R531 Weakness: Secondary | ICD-10-CM | POA: Diagnosis not present

## 2017-08-14 DIAGNOSIS — R296 Repeated falls: Secondary | ICD-10-CM | POA: Diagnosis not present

## 2017-08-14 DIAGNOSIS — G20 Parkinson's disease: Secondary | ICD-10-CM | POA: Diagnosis not present

## 2017-08-14 DIAGNOSIS — F419 Anxiety disorder, unspecified: Secondary | ICD-10-CM | POA: Diagnosis not present

## 2017-08-14 DIAGNOSIS — R531 Weakness: Secondary | ICD-10-CM | POA: Diagnosis not present

## 2017-08-14 DIAGNOSIS — R260 Ataxic gait: Secondary | ICD-10-CM | POA: Diagnosis not present

## 2017-08-14 DIAGNOSIS — R451 Restlessness and agitation: Secondary | ICD-10-CM | POA: Diagnosis not present

## 2017-08-15 DIAGNOSIS — F419 Anxiety disorder, unspecified: Secondary | ICD-10-CM | POA: Diagnosis not present

## 2017-08-15 DIAGNOSIS — R260 Ataxic gait: Secondary | ICD-10-CM | POA: Diagnosis not present

## 2017-08-15 DIAGNOSIS — R451 Restlessness and agitation: Secondary | ICD-10-CM | POA: Diagnosis not present

## 2017-08-15 DIAGNOSIS — R531 Weakness: Secondary | ICD-10-CM | POA: Diagnosis not present

## 2017-08-15 DIAGNOSIS — R296 Repeated falls: Secondary | ICD-10-CM | POA: Diagnosis not present

## 2017-08-15 DIAGNOSIS — G20 Parkinson's disease: Secondary | ICD-10-CM | POA: Diagnosis not present

## 2017-08-17 DIAGNOSIS — R531 Weakness: Secondary | ICD-10-CM | POA: Diagnosis not present

## 2017-08-17 DIAGNOSIS — F419 Anxiety disorder, unspecified: Secondary | ICD-10-CM | POA: Diagnosis not present

## 2017-08-17 DIAGNOSIS — G20 Parkinson's disease: Secondary | ICD-10-CM | POA: Diagnosis not present

## 2017-08-17 DIAGNOSIS — R260 Ataxic gait: Secondary | ICD-10-CM | POA: Diagnosis not present

## 2017-08-17 DIAGNOSIS — R296 Repeated falls: Secondary | ICD-10-CM | POA: Diagnosis not present

## 2017-08-17 DIAGNOSIS — R451 Restlessness and agitation: Secondary | ICD-10-CM | POA: Diagnosis not present

## 2017-08-20 DIAGNOSIS — G20 Parkinson's disease: Secondary | ICD-10-CM | POA: Diagnosis not present

## 2017-08-20 DIAGNOSIS — R260 Ataxic gait: Secondary | ICD-10-CM | POA: Diagnosis not present

## 2017-08-20 DIAGNOSIS — F419 Anxiety disorder, unspecified: Secondary | ICD-10-CM | POA: Diagnosis not present

## 2017-08-20 DIAGNOSIS — R296 Repeated falls: Secondary | ICD-10-CM | POA: Diagnosis not present

## 2017-08-20 DIAGNOSIS — R451 Restlessness and agitation: Secondary | ICD-10-CM | POA: Diagnosis not present

## 2017-08-20 DIAGNOSIS — R531 Weakness: Secondary | ICD-10-CM | POA: Diagnosis not present

## 2017-08-22 DIAGNOSIS — R451 Restlessness and agitation: Secondary | ICD-10-CM | POA: Diagnosis not present

## 2017-08-22 DIAGNOSIS — R296 Repeated falls: Secondary | ICD-10-CM | POA: Diagnosis not present

## 2017-08-22 DIAGNOSIS — F419 Anxiety disorder, unspecified: Secondary | ICD-10-CM | POA: Diagnosis not present

## 2017-08-22 DIAGNOSIS — G20 Parkinson's disease: Secondary | ICD-10-CM | POA: Diagnosis not present

## 2017-08-22 DIAGNOSIS — R531 Weakness: Secondary | ICD-10-CM | POA: Diagnosis not present

## 2017-08-22 DIAGNOSIS — R260 Ataxic gait: Secondary | ICD-10-CM | POA: Diagnosis not present

## 2017-08-27 DIAGNOSIS — F419 Anxiety disorder, unspecified: Secondary | ICD-10-CM | POA: Diagnosis not present

## 2017-08-27 DIAGNOSIS — G20 Parkinson's disease: Secondary | ICD-10-CM | POA: Diagnosis not present

## 2017-08-27 DIAGNOSIS — R531 Weakness: Secondary | ICD-10-CM | POA: Diagnosis not present

## 2017-08-27 DIAGNOSIS — R296 Repeated falls: Secondary | ICD-10-CM | POA: Diagnosis not present

## 2017-08-27 DIAGNOSIS — R260 Ataxic gait: Secondary | ICD-10-CM | POA: Diagnosis not present

## 2017-08-27 DIAGNOSIS — R451 Restlessness and agitation: Secondary | ICD-10-CM | POA: Diagnosis not present

## 2017-08-29 DIAGNOSIS — R296 Repeated falls: Secondary | ICD-10-CM | POA: Diagnosis not present

## 2017-08-29 DIAGNOSIS — F419 Anxiety disorder, unspecified: Secondary | ICD-10-CM | POA: Diagnosis not present

## 2017-08-29 DIAGNOSIS — G20 Parkinson's disease: Secondary | ICD-10-CM | POA: Diagnosis not present

## 2017-08-29 DIAGNOSIS — R451 Restlessness and agitation: Secondary | ICD-10-CM | POA: Diagnosis not present

## 2017-08-29 DIAGNOSIS — R531 Weakness: Secondary | ICD-10-CM | POA: Diagnosis not present

## 2017-08-29 DIAGNOSIS — R260 Ataxic gait: Secondary | ICD-10-CM | POA: Diagnosis not present

## 2017-08-31 DIAGNOSIS — F419 Anxiety disorder, unspecified: Secondary | ICD-10-CM | POA: Diagnosis not present

## 2017-08-31 DIAGNOSIS — R451 Restlessness and agitation: Secondary | ICD-10-CM | POA: Diagnosis not present

## 2017-08-31 DIAGNOSIS — R260 Ataxic gait: Secondary | ICD-10-CM | POA: Diagnosis not present

## 2017-08-31 DIAGNOSIS — R296 Repeated falls: Secondary | ICD-10-CM | POA: Diagnosis not present

## 2017-08-31 DIAGNOSIS — R531 Weakness: Secondary | ICD-10-CM | POA: Diagnosis not present

## 2017-08-31 DIAGNOSIS — G20 Parkinson's disease: Secondary | ICD-10-CM | POA: Diagnosis not present

## 2017-09-01 DIAGNOSIS — R531 Weakness: Secondary | ICD-10-CM | POA: Diagnosis not present

## 2017-09-01 DIAGNOSIS — F419 Anxiety disorder, unspecified: Secondary | ICD-10-CM | POA: Diagnosis not present

## 2017-09-01 DIAGNOSIS — R451 Restlessness and agitation: Secondary | ICD-10-CM | POA: Diagnosis not present

## 2017-09-01 DIAGNOSIS — Z66 Do not resuscitate: Secondary | ICD-10-CM | POA: Diagnosis not present

## 2017-09-01 DIAGNOSIS — R296 Repeated falls: Secondary | ICD-10-CM | POA: Diagnosis not present

## 2017-09-01 DIAGNOSIS — Z741 Need for assistance with personal care: Secondary | ICD-10-CM | POA: Diagnosis not present

## 2017-09-01 DIAGNOSIS — G20 Parkinson's disease: Secondary | ICD-10-CM | POA: Diagnosis not present

## 2017-09-01 DIAGNOSIS — Z743 Need for continuous supervision: Secondary | ICD-10-CM | POA: Diagnosis not present

## 2017-09-01 DIAGNOSIS — R260 Ataxic gait: Secondary | ICD-10-CM | POA: Diagnosis not present

## 2017-09-01 DIAGNOSIS — R634 Abnormal weight loss: Secondary | ICD-10-CM | POA: Diagnosis not present

## 2017-09-01 DIAGNOSIS — R471 Dysarthria and anarthria: Secondary | ICD-10-CM | POA: Diagnosis not present

## 2017-09-03 DIAGNOSIS — F419 Anxiety disorder, unspecified: Secondary | ICD-10-CM | POA: Diagnosis not present

## 2017-09-03 DIAGNOSIS — R296 Repeated falls: Secondary | ICD-10-CM | POA: Diagnosis not present

## 2017-09-03 DIAGNOSIS — R531 Weakness: Secondary | ICD-10-CM | POA: Diagnosis not present

## 2017-09-03 DIAGNOSIS — R451 Restlessness and agitation: Secondary | ICD-10-CM | POA: Diagnosis not present

## 2017-09-03 DIAGNOSIS — G20 Parkinson's disease: Secondary | ICD-10-CM | POA: Diagnosis not present

## 2017-09-03 DIAGNOSIS — R260 Ataxic gait: Secondary | ICD-10-CM | POA: Diagnosis not present

## 2017-09-05 DIAGNOSIS — F419 Anxiety disorder, unspecified: Secondary | ICD-10-CM | POA: Diagnosis not present

## 2017-09-05 DIAGNOSIS — R260 Ataxic gait: Secondary | ICD-10-CM | POA: Diagnosis not present

## 2017-09-05 DIAGNOSIS — R451 Restlessness and agitation: Secondary | ICD-10-CM | POA: Diagnosis not present

## 2017-09-05 DIAGNOSIS — R531 Weakness: Secondary | ICD-10-CM | POA: Diagnosis not present

## 2017-09-05 DIAGNOSIS — R296 Repeated falls: Secondary | ICD-10-CM | POA: Diagnosis not present

## 2017-09-05 DIAGNOSIS — G20 Parkinson's disease: Secondary | ICD-10-CM | POA: Diagnosis not present

## 2017-09-07 DIAGNOSIS — R531 Weakness: Secondary | ICD-10-CM | POA: Diagnosis not present

## 2017-09-07 DIAGNOSIS — R296 Repeated falls: Secondary | ICD-10-CM | POA: Diagnosis not present

## 2017-09-07 DIAGNOSIS — G20 Parkinson's disease: Secondary | ICD-10-CM | POA: Diagnosis not present

## 2017-09-07 DIAGNOSIS — F419 Anxiety disorder, unspecified: Secondary | ICD-10-CM | POA: Diagnosis not present

## 2017-09-07 DIAGNOSIS — R451 Restlessness and agitation: Secondary | ICD-10-CM | POA: Diagnosis not present

## 2017-09-07 DIAGNOSIS — R260 Ataxic gait: Secondary | ICD-10-CM | POA: Diagnosis not present

## 2017-09-11 DIAGNOSIS — R296 Repeated falls: Secondary | ICD-10-CM | POA: Diagnosis not present

## 2017-09-11 DIAGNOSIS — R260 Ataxic gait: Secondary | ICD-10-CM | POA: Diagnosis not present

## 2017-09-11 DIAGNOSIS — F419 Anxiety disorder, unspecified: Secondary | ICD-10-CM | POA: Diagnosis not present

## 2017-09-11 DIAGNOSIS — R451 Restlessness and agitation: Secondary | ICD-10-CM | POA: Diagnosis not present

## 2017-09-11 DIAGNOSIS — R531 Weakness: Secondary | ICD-10-CM | POA: Diagnosis not present

## 2017-09-11 DIAGNOSIS — G20 Parkinson's disease: Secondary | ICD-10-CM | POA: Diagnosis not present

## 2017-09-12 DIAGNOSIS — R451 Restlessness and agitation: Secondary | ICD-10-CM | POA: Diagnosis not present

## 2017-09-12 DIAGNOSIS — G20 Parkinson's disease: Secondary | ICD-10-CM | POA: Diagnosis not present

## 2017-09-12 DIAGNOSIS — R260 Ataxic gait: Secondary | ICD-10-CM | POA: Diagnosis not present

## 2017-09-12 DIAGNOSIS — R531 Weakness: Secondary | ICD-10-CM | POA: Diagnosis not present

## 2017-09-12 DIAGNOSIS — F419 Anxiety disorder, unspecified: Secondary | ICD-10-CM | POA: Diagnosis not present

## 2017-09-12 DIAGNOSIS — R296 Repeated falls: Secondary | ICD-10-CM | POA: Diagnosis not present

## 2017-09-14 DIAGNOSIS — R260 Ataxic gait: Secondary | ICD-10-CM | POA: Diagnosis not present

## 2017-09-14 DIAGNOSIS — R296 Repeated falls: Secondary | ICD-10-CM | POA: Diagnosis not present

## 2017-09-14 DIAGNOSIS — R531 Weakness: Secondary | ICD-10-CM | POA: Diagnosis not present

## 2017-09-14 DIAGNOSIS — R451 Restlessness and agitation: Secondary | ICD-10-CM | POA: Diagnosis not present

## 2017-09-14 DIAGNOSIS — G20 Parkinson's disease: Secondary | ICD-10-CM | POA: Diagnosis not present

## 2017-09-14 DIAGNOSIS — F419 Anxiety disorder, unspecified: Secondary | ICD-10-CM | POA: Diagnosis not present

## 2017-09-15 DIAGNOSIS — R531 Weakness: Secondary | ICD-10-CM | POA: Diagnosis not present

## 2017-09-15 DIAGNOSIS — G20 Parkinson's disease: Secondary | ICD-10-CM | POA: Diagnosis not present

## 2017-09-15 DIAGNOSIS — R260 Ataxic gait: Secondary | ICD-10-CM | POA: Diagnosis not present

## 2017-09-15 DIAGNOSIS — R296 Repeated falls: Secondary | ICD-10-CM | POA: Diagnosis not present

## 2017-09-15 DIAGNOSIS — F419 Anxiety disorder, unspecified: Secondary | ICD-10-CM | POA: Diagnosis not present

## 2017-09-15 DIAGNOSIS — R451 Restlessness and agitation: Secondary | ICD-10-CM | POA: Diagnosis not present

## 2017-09-17 DIAGNOSIS — F419 Anxiety disorder, unspecified: Secondary | ICD-10-CM | POA: Diagnosis not present

## 2017-09-17 DIAGNOSIS — R260 Ataxic gait: Secondary | ICD-10-CM | POA: Diagnosis not present

## 2017-09-17 DIAGNOSIS — G20 Parkinson's disease: Secondary | ICD-10-CM | POA: Diagnosis not present

## 2017-09-17 DIAGNOSIS — R531 Weakness: Secondary | ICD-10-CM | POA: Diagnosis not present

## 2017-09-17 DIAGNOSIS — R296 Repeated falls: Secondary | ICD-10-CM | POA: Diagnosis not present

## 2017-09-17 DIAGNOSIS — R451 Restlessness and agitation: Secondary | ICD-10-CM | POA: Diagnosis not present

## 2017-09-19 DIAGNOSIS — F419 Anxiety disorder, unspecified: Secondary | ICD-10-CM | POA: Diagnosis not present

## 2017-09-19 DIAGNOSIS — R260 Ataxic gait: Secondary | ICD-10-CM | POA: Diagnosis not present

## 2017-09-19 DIAGNOSIS — R531 Weakness: Secondary | ICD-10-CM | POA: Diagnosis not present

## 2017-09-19 DIAGNOSIS — G20 Parkinson's disease: Secondary | ICD-10-CM | POA: Diagnosis not present

## 2017-09-19 DIAGNOSIS — R296 Repeated falls: Secondary | ICD-10-CM | POA: Diagnosis not present

## 2017-09-19 DIAGNOSIS — R451 Restlessness and agitation: Secondary | ICD-10-CM | POA: Diagnosis not present

## 2017-09-24 DIAGNOSIS — R260 Ataxic gait: Secondary | ICD-10-CM | POA: Diagnosis not present

## 2017-09-24 DIAGNOSIS — G20 Parkinson's disease: Secondary | ICD-10-CM | POA: Diagnosis not present

## 2017-09-24 DIAGNOSIS — R531 Weakness: Secondary | ICD-10-CM | POA: Diagnosis not present

## 2017-09-24 DIAGNOSIS — F419 Anxiety disorder, unspecified: Secondary | ICD-10-CM | POA: Diagnosis not present

## 2017-09-24 DIAGNOSIS — R451 Restlessness and agitation: Secondary | ICD-10-CM | POA: Diagnosis not present

## 2017-09-24 DIAGNOSIS — R296 Repeated falls: Secondary | ICD-10-CM | POA: Diagnosis not present

## 2017-09-25 DIAGNOSIS — G20 Parkinson's disease: Secondary | ICD-10-CM | POA: Diagnosis not present

## 2017-09-25 DIAGNOSIS — R260 Ataxic gait: Secondary | ICD-10-CM | POA: Diagnosis not present

## 2017-09-25 DIAGNOSIS — R531 Weakness: Secondary | ICD-10-CM | POA: Diagnosis not present

## 2017-09-25 DIAGNOSIS — F419 Anxiety disorder, unspecified: Secondary | ICD-10-CM | POA: Diagnosis not present

## 2017-09-25 DIAGNOSIS — R296 Repeated falls: Secondary | ICD-10-CM | POA: Diagnosis not present

## 2017-09-25 DIAGNOSIS — R451 Restlessness and agitation: Secondary | ICD-10-CM | POA: Diagnosis not present

## 2017-09-26 DIAGNOSIS — R451 Restlessness and agitation: Secondary | ICD-10-CM | POA: Diagnosis not present

## 2017-09-26 DIAGNOSIS — R531 Weakness: Secondary | ICD-10-CM | POA: Diagnosis not present

## 2017-09-26 DIAGNOSIS — F419 Anxiety disorder, unspecified: Secondary | ICD-10-CM | POA: Diagnosis not present

## 2017-09-26 DIAGNOSIS — R296 Repeated falls: Secondary | ICD-10-CM | POA: Diagnosis not present

## 2017-09-26 DIAGNOSIS — R260 Ataxic gait: Secondary | ICD-10-CM | POA: Diagnosis not present

## 2017-09-26 DIAGNOSIS — G20 Parkinson's disease: Secondary | ICD-10-CM | POA: Diagnosis not present

## 2017-09-27 DIAGNOSIS — R296 Repeated falls: Secondary | ICD-10-CM | POA: Diagnosis not present

## 2017-09-27 DIAGNOSIS — R531 Weakness: Secondary | ICD-10-CM | POA: Diagnosis not present

## 2017-09-27 DIAGNOSIS — R451 Restlessness and agitation: Secondary | ICD-10-CM | POA: Diagnosis not present

## 2017-09-27 DIAGNOSIS — R260 Ataxic gait: Secondary | ICD-10-CM | POA: Diagnosis not present

## 2017-09-27 DIAGNOSIS — F419 Anxiety disorder, unspecified: Secondary | ICD-10-CM | POA: Diagnosis not present

## 2017-09-27 DIAGNOSIS — G20 Parkinson's disease: Secondary | ICD-10-CM | POA: Diagnosis not present

## 2017-09-28 DIAGNOSIS — F419 Anxiety disorder, unspecified: Secondary | ICD-10-CM | POA: Diagnosis not present

## 2017-09-28 DIAGNOSIS — R260 Ataxic gait: Secondary | ICD-10-CM | POA: Diagnosis not present

## 2017-09-28 DIAGNOSIS — R451 Restlessness and agitation: Secondary | ICD-10-CM | POA: Diagnosis not present

## 2017-09-28 DIAGNOSIS — R296 Repeated falls: Secondary | ICD-10-CM | POA: Diagnosis not present

## 2017-09-28 DIAGNOSIS — G20 Parkinson's disease: Secondary | ICD-10-CM | POA: Diagnosis not present

## 2017-09-28 DIAGNOSIS — R531 Weakness: Secondary | ICD-10-CM | POA: Diagnosis not present

## 2017-09-29 DIAGNOSIS — G20 Parkinson's disease: Secondary | ICD-10-CM | POA: Diagnosis not present

## 2017-09-29 DIAGNOSIS — R451 Restlessness and agitation: Secondary | ICD-10-CM | POA: Diagnosis not present

## 2017-09-29 DIAGNOSIS — F419 Anxiety disorder, unspecified: Secondary | ICD-10-CM | POA: Diagnosis not present

## 2017-09-29 DIAGNOSIS — R296 Repeated falls: Secondary | ICD-10-CM | POA: Diagnosis not present

## 2017-09-29 DIAGNOSIS — R531 Weakness: Secondary | ICD-10-CM | POA: Diagnosis not present

## 2017-09-29 DIAGNOSIS — R260 Ataxic gait: Secondary | ICD-10-CM | POA: Diagnosis not present

## 2017-10-01 DIAGNOSIS — F419 Anxiety disorder, unspecified: Secondary | ICD-10-CM | POA: Diagnosis not present

## 2017-10-01 DIAGNOSIS — R260 Ataxic gait: Secondary | ICD-10-CM | POA: Diagnosis not present

## 2017-10-01 DIAGNOSIS — G20 Parkinson's disease: Secondary | ICD-10-CM | POA: Diagnosis not present

## 2017-10-01 DIAGNOSIS — R451 Restlessness and agitation: Secondary | ICD-10-CM | POA: Diagnosis not present

## 2017-10-01 DIAGNOSIS — R531 Weakness: Secondary | ICD-10-CM | POA: Diagnosis not present

## 2017-10-01 DIAGNOSIS — R296 Repeated falls: Secondary | ICD-10-CM | POA: Diagnosis not present

## 2017-10-02 DIAGNOSIS — R634 Abnormal weight loss: Secondary | ICD-10-CM | POA: Diagnosis not present

## 2017-10-02 DIAGNOSIS — R260 Ataxic gait: Secondary | ICD-10-CM | POA: Diagnosis not present

## 2017-10-02 DIAGNOSIS — R471 Dysarthria and anarthria: Secondary | ICD-10-CM | POA: Diagnosis not present

## 2017-10-02 DIAGNOSIS — F419 Anxiety disorder, unspecified: Secondary | ICD-10-CM | POA: Diagnosis not present

## 2017-10-02 DIAGNOSIS — R531 Weakness: Secondary | ICD-10-CM | POA: Diagnosis not present

## 2017-10-02 DIAGNOSIS — Z741 Need for assistance with personal care: Secondary | ICD-10-CM | POA: Diagnosis not present

## 2017-10-02 DIAGNOSIS — Z743 Need for continuous supervision: Secondary | ICD-10-CM | POA: Diagnosis not present

## 2017-10-02 DIAGNOSIS — R296 Repeated falls: Secondary | ICD-10-CM | POA: Diagnosis not present

## 2017-10-02 DIAGNOSIS — R451 Restlessness and agitation: Secondary | ICD-10-CM | POA: Diagnosis not present

## 2017-10-02 DIAGNOSIS — Z66 Do not resuscitate: Secondary | ICD-10-CM | POA: Diagnosis not present

## 2017-10-02 DIAGNOSIS — G20 Parkinson's disease: Secondary | ICD-10-CM | POA: Diagnosis not present

## 2017-10-03 DIAGNOSIS — F419 Anxiety disorder, unspecified: Secondary | ICD-10-CM | POA: Diagnosis not present

## 2017-10-03 DIAGNOSIS — R260 Ataxic gait: Secondary | ICD-10-CM | POA: Diagnosis not present

## 2017-10-03 DIAGNOSIS — R531 Weakness: Secondary | ICD-10-CM | POA: Diagnosis not present

## 2017-10-03 DIAGNOSIS — R451 Restlessness and agitation: Secondary | ICD-10-CM | POA: Diagnosis not present

## 2017-10-03 DIAGNOSIS — G20 Parkinson's disease: Secondary | ICD-10-CM | POA: Diagnosis not present

## 2017-10-03 DIAGNOSIS — R296 Repeated falls: Secondary | ICD-10-CM | POA: Diagnosis not present

## 2017-10-04 DIAGNOSIS — R260 Ataxic gait: Secondary | ICD-10-CM | POA: Diagnosis not present

## 2017-10-04 DIAGNOSIS — R451 Restlessness and agitation: Secondary | ICD-10-CM | POA: Diagnosis not present

## 2017-10-04 DIAGNOSIS — R531 Weakness: Secondary | ICD-10-CM | POA: Diagnosis not present

## 2017-10-04 DIAGNOSIS — R296 Repeated falls: Secondary | ICD-10-CM | POA: Diagnosis not present

## 2017-10-04 DIAGNOSIS — F419 Anxiety disorder, unspecified: Secondary | ICD-10-CM | POA: Diagnosis not present

## 2017-10-04 DIAGNOSIS — G20 Parkinson's disease: Secondary | ICD-10-CM | POA: Diagnosis not present

## 2017-10-05 DIAGNOSIS — R296 Repeated falls: Secondary | ICD-10-CM | POA: Diagnosis not present

## 2017-10-05 DIAGNOSIS — R260 Ataxic gait: Secondary | ICD-10-CM | POA: Diagnosis not present

## 2017-10-05 DIAGNOSIS — G20 Parkinson's disease: Secondary | ICD-10-CM | POA: Diagnosis not present

## 2017-10-05 DIAGNOSIS — R451 Restlessness and agitation: Secondary | ICD-10-CM | POA: Diagnosis not present

## 2017-10-05 DIAGNOSIS — F419 Anxiety disorder, unspecified: Secondary | ICD-10-CM | POA: Diagnosis not present

## 2017-10-05 DIAGNOSIS — R531 Weakness: Secondary | ICD-10-CM | POA: Diagnosis not present

## 2017-10-08 DIAGNOSIS — R296 Repeated falls: Secondary | ICD-10-CM | POA: Diagnosis not present

## 2017-10-08 DIAGNOSIS — R451 Restlessness and agitation: Secondary | ICD-10-CM | POA: Diagnosis not present

## 2017-10-08 DIAGNOSIS — G20 Parkinson's disease: Secondary | ICD-10-CM | POA: Diagnosis not present

## 2017-10-08 DIAGNOSIS — R531 Weakness: Secondary | ICD-10-CM | POA: Diagnosis not present

## 2017-10-08 DIAGNOSIS — R260 Ataxic gait: Secondary | ICD-10-CM | POA: Diagnosis not present

## 2017-10-08 DIAGNOSIS — F419 Anxiety disorder, unspecified: Secondary | ICD-10-CM | POA: Diagnosis not present

## 2017-10-10 DIAGNOSIS — R451 Restlessness and agitation: Secondary | ICD-10-CM | POA: Diagnosis not present

## 2017-10-10 DIAGNOSIS — G20 Parkinson's disease: Secondary | ICD-10-CM | POA: Diagnosis not present

## 2017-10-10 DIAGNOSIS — F419 Anxiety disorder, unspecified: Secondary | ICD-10-CM | POA: Diagnosis not present

## 2017-10-10 DIAGNOSIS — R296 Repeated falls: Secondary | ICD-10-CM | POA: Diagnosis not present

## 2017-10-10 DIAGNOSIS — R260 Ataxic gait: Secondary | ICD-10-CM | POA: Diagnosis not present

## 2017-10-10 DIAGNOSIS — R531 Weakness: Secondary | ICD-10-CM | POA: Diagnosis not present

## 2017-10-11 DIAGNOSIS — R451 Restlessness and agitation: Secondary | ICD-10-CM | POA: Diagnosis not present

## 2017-10-11 DIAGNOSIS — R260 Ataxic gait: Secondary | ICD-10-CM | POA: Diagnosis not present

## 2017-10-11 DIAGNOSIS — F419 Anxiety disorder, unspecified: Secondary | ICD-10-CM | POA: Diagnosis not present

## 2017-10-11 DIAGNOSIS — G20 Parkinson's disease: Secondary | ICD-10-CM | POA: Diagnosis not present

## 2017-10-11 DIAGNOSIS — R531 Weakness: Secondary | ICD-10-CM | POA: Diagnosis not present

## 2017-10-11 DIAGNOSIS — R296 Repeated falls: Secondary | ICD-10-CM | POA: Diagnosis not present

## 2017-10-12 DIAGNOSIS — G20 Parkinson's disease: Secondary | ICD-10-CM | POA: Diagnosis not present

## 2017-10-12 DIAGNOSIS — R451 Restlessness and agitation: Secondary | ICD-10-CM | POA: Diagnosis not present

## 2017-10-12 DIAGNOSIS — F419 Anxiety disorder, unspecified: Secondary | ICD-10-CM | POA: Diagnosis not present

## 2017-10-12 DIAGNOSIS — R531 Weakness: Secondary | ICD-10-CM | POA: Diagnosis not present

## 2017-10-12 DIAGNOSIS — R296 Repeated falls: Secondary | ICD-10-CM | POA: Diagnosis not present

## 2017-10-12 DIAGNOSIS — R260 Ataxic gait: Secondary | ICD-10-CM | POA: Diagnosis not present

## 2017-10-15 DIAGNOSIS — R531 Weakness: Secondary | ICD-10-CM | POA: Diagnosis not present

## 2017-10-15 DIAGNOSIS — G20 Parkinson's disease: Secondary | ICD-10-CM | POA: Diagnosis not present

## 2017-10-15 DIAGNOSIS — R260 Ataxic gait: Secondary | ICD-10-CM | POA: Diagnosis not present

## 2017-10-15 DIAGNOSIS — R451 Restlessness and agitation: Secondary | ICD-10-CM | POA: Diagnosis not present

## 2017-10-15 DIAGNOSIS — F419 Anxiety disorder, unspecified: Secondary | ICD-10-CM | POA: Diagnosis not present

## 2017-10-15 DIAGNOSIS — R296 Repeated falls: Secondary | ICD-10-CM | POA: Diagnosis not present

## 2017-10-17 DIAGNOSIS — F419 Anxiety disorder, unspecified: Secondary | ICD-10-CM | POA: Diagnosis not present

## 2017-10-17 DIAGNOSIS — R296 Repeated falls: Secondary | ICD-10-CM | POA: Diagnosis not present

## 2017-10-17 DIAGNOSIS — R260 Ataxic gait: Secondary | ICD-10-CM | POA: Diagnosis not present

## 2017-10-17 DIAGNOSIS — R451 Restlessness and agitation: Secondary | ICD-10-CM | POA: Diagnosis not present

## 2017-10-17 DIAGNOSIS — G20 Parkinson's disease: Secondary | ICD-10-CM | POA: Diagnosis not present

## 2017-10-17 DIAGNOSIS — R531 Weakness: Secondary | ICD-10-CM | POA: Diagnosis not present

## 2017-10-19 DIAGNOSIS — G20 Parkinson's disease: Secondary | ICD-10-CM | POA: Diagnosis not present

## 2017-10-19 DIAGNOSIS — R260 Ataxic gait: Secondary | ICD-10-CM | POA: Diagnosis not present

## 2017-10-19 DIAGNOSIS — R531 Weakness: Secondary | ICD-10-CM | POA: Diagnosis not present

## 2017-10-19 DIAGNOSIS — R451 Restlessness and agitation: Secondary | ICD-10-CM | POA: Diagnosis not present

## 2017-10-19 DIAGNOSIS — R296 Repeated falls: Secondary | ICD-10-CM | POA: Diagnosis not present

## 2017-10-19 DIAGNOSIS — F419 Anxiety disorder, unspecified: Secondary | ICD-10-CM | POA: Diagnosis not present

## 2017-10-22 DIAGNOSIS — R451 Restlessness and agitation: Secondary | ICD-10-CM | POA: Diagnosis not present

## 2017-10-22 DIAGNOSIS — R531 Weakness: Secondary | ICD-10-CM | POA: Diagnosis not present

## 2017-10-22 DIAGNOSIS — F419 Anxiety disorder, unspecified: Secondary | ICD-10-CM | POA: Diagnosis not present

## 2017-10-22 DIAGNOSIS — G20 Parkinson's disease: Secondary | ICD-10-CM | POA: Diagnosis not present

## 2017-10-22 DIAGNOSIS — F0281 Dementia in other diseases classified elsewhere with behavioral disturbance: Secondary | ICD-10-CM | POA: Diagnosis not present

## 2017-10-22 DIAGNOSIS — R260 Ataxic gait: Secondary | ICD-10-CM | POA: Diagnosis not present

## 2017-10-22 DIAGNOSIS — R296 Repeated falls: Secondary | ICD-10-CM | POA: Diagnosis not present

## 2017-10-24 DIAGNOSIS — F419 Anxiety disorder, unspecified: Secondary | ICD-10-CM | POA: Diagnosis not present

## 2017-10-24 DIAGNOSIS — R451 Restlessness and agitation: Secondary | ICD-10-CM | POA: Diagnosis not present

## 2017-10-24 DIAGNOSIS — R531 Weakness: Secondary | ICD-10-CM | POA: Diagnosis not present

## 2017-10-24 DIAGNOSIS — R296 Repeated falls: Secondary | ICD-10-CM | POA: Diagnosis not present

## 2017-10-24 DIAGNOSIS — G20 Parkinson's disease: Secondary | ICD-10-CM | POA: Diagnosis not present

## 2017-10-24 DIAGNOSIS — R260 Ataxic gait: Secondary | ICD-10-CM | POA: Diagnosis not present

## 2017-10-29 DIAGNOSIS — R260 Ataxic gait: Secondary | ICD-10-CM | POA: Diagnosis not present

## 2017-10-29 DIAGNOSIS — R451 Restlessness and agitation: Secondary | ICD-10-CM | POA: Diagnosis not present

## 2017-10-29 DIAGNOSIS — G20 Parkinson's disease: Secondary | ICD-10-CM | POA: Diagnosis not present

## 2017-10-29 DIAGNOSIS — F419 Anxiety disorder, unspecified: Secondary | ICD-10-CM | POA: Diagnosis not present

## 2017-10-29 DIAGNOSIS — R531 Weakness: Secondary | ICD-10-CM | POA: Diagnosis not present

## 2017-10-29 DIAGNOSIS — R296 Repeated falls: Secondary | ICD-10-CM | POA: Diagnosis not present

## 2017-10-30 DIAGNOSIS — R260 Ataxic gait: Secondary | ICD-10-CM | POA: Diagnosis not present

## 2017-10-30 DIAGNOSIS — R451 Restlessness and agitation: Secondary | ICD-10-CM | POA: Diagnosis not present

## 2017-10-30 DIAGNOSIS — R531 Weakness: Secondary | ICD-10-CM | POA: Diagnosis not present

## 2017-10-30 DIAGNOSIS — R296 Repeated falls: Secondary | ICD-10-CM | POA: Diagnosis not present

## 2017-10-30 DIAGNOSIS — G20 Parkinson's disease: Secondary | ICD-10-CM | POA: Diagnosis not present

## 2017-10-30 DIAGNOSIS — F419 Anxiety disorder, unspecified: Secondary | ICD-10-CM | POA: Diagnosis not present

## 2017-10-31 DIAGNOSIS — R296 Repeated falls: Secondary | ICD-10-CM | POA: Diagnosis not present

## 2017-10-31 DIAGNOSIS — F419 Anxiety disorder, unspecified: Secondary | ICD-10-CM | POA: Diagnosis not present

## 2017-10-31 DIAGNOSIS — R260 Ataxic gait: Secondary | ICD-10-CM | POA: Diagnosis not present

## 2017-10-31 DIAGNOSIS — R451 Restlessness and agitation: Secondary | ICD-10-CM | POA: Diagnosis not present

## 2017-10-31 DIAGNOSIS — G20 Parkinson's disease: Secondary | ICD-10-CM | POA: Diagnosis not present

## 2017-10-31 DIAGNOSIS — R531 Weakness: Secondary | ICD-10-CM | POA: Diagnosis not present

## 2017-11-02 DIAGNOSIS — Z66 Do not resuscitate: Secondary | ICD-10-CM | POA: Diagnosis not present

## 2017-11-02 DIAGNOSIS — G20 Parkinson's disease: Secondary | ICD-10-CM | POA: Diagnosis not present

## 2017-11-02 DIAGNOSIS — Z743 Need for continuous supervision: Secondary | ICD-10-CM | POA: Diagnosis not present

## 2017-11-02 DIAGNOSIS — R296 Repeated falls: Secondary | ICD-10-CM | POA: Diagnosis not present

## 2017-11-02 DIAGNOSIS — R471 Dysarthria and anarthria: Secondary | ICD-10-CM | POA: Diagnosis not present

## 2017-11-02 DIAGNOSIS — R451 Restlessness and agitation: Secondary | ICD-10-CM | POA: Diagnosis not present

## 2017-11-02 DIAGNOSIS — R260 Ataxic gait: Secondary | ICD-10-CM | POA: Diagnosis not present

## 2017-11-02 DIAGNOSIS — Z741 Need for assistance with personal care: Secondary | ICD-10-CM | POA: Diagnosis not present

## 2017-11-02 DIAGNOSIS — F419 Anxiety disorder, unspecified: Secondary | ICD-10-CM | POA: Diagnosis not present

## 2017-11-02 DIAGNOSIS — R531 Weakness: Secondary | ICD-10-CM | POA: Diagnosis not present

## 2017-11-02 DIAGNOSIS — R634 Abnormal weight loss: Secondary | ICD-10-CM | POA: Diagnosis not present

## 2017-11-04 DIAGNOSIS — R296 Repeated falls: Secondary | ICD-10-CM | POA: Diagnosis not present

## 2017-11-04 DIAGNOSIS — R451 Restlessness and agitation: Secondary | ICD-10-CM | POA: Diagnosis not present

## 2017-11-04 DIAGNOSIS — F419 Anxiety disorder, unspecified: Secondary | ICD-10-CM | POA: Diagnosis not present

## 2017-11-04 DIAGNOSIS — R531 Weakness: Secondary | ICD-10-CM | POA: Diagnosis not present

## 2017-11-04 DIAGNOSIS — G20 Parkinson's disease: Secondary | ICD-10-CM | POA: Diagnosis not present

## 2017-11-04 DIAGNOSIS — R260 Ataxic gait: Secondary | ICD-10-CM | POA: Diagnosis not present

## 2017-11-05 DIAGNOSIS — R451 Restlessness and agitation: Secondary | ICD-10-CM | POA: Diagnosis not present

## 2017-11-05 DIAGNOSIS — G20 Parkinson's disease: Secondary | ICD-10-CM | POA: Diagnosis not present

## 2017-11-05 DIAGNOSIS — F419 Anxiety disorder, unspecified: Secondary | ICD-10-CM | POA: Diagnosis not present

## 2017-11-05 DIAGNOSIS — R531 Weakness: Secondary | ICD-10-CM | POA: Diagnosis not present

## 2017-11-05 DIAGNOSIS — R296 Repeated falls: Secondary | ICD-10-CM | POA: Diagnosis not present

## 2017-11-05 DIAGNOSIS — R260 Ataxic gait: Secondary | ICD-10-CM | POA: Diagnosis not present

## 2017-11-06 DIAGNOSIS — G20 Parkinson's disease: Secondary | ICD-10-CM | POA: Diagnosis not present

## 2017-11-06 DIAGNOSIS — R296 Repeated falls: Secondary | ICD-10-CM | POA: Diagnosis not present

## 2017-11-06 DIAGNOSIS — R260 Ataxic gait: Secondary | ICD-10-CM | POA: Diagnosis not present

## 2017-11-06 DIAGNOSIS — R531 Weakness: Secondary | ICD-10-CM | POA: Diagnosis not present

## 2017-11-06 DIAGNOSIS — F419 Anxiety disorder, unspecified: Secondary | ICD-10-CM | POA: Diagnosis not present

## 2017-11-06 DIAGNOSIS — R451 Restlessness and agitation: Secondary | ICD-10-CM | POA: Diagnosis not present

## 2017-11-07 DIAGNOSIS — R296 Repeated falls: Secondary | ICD-10-CM | POA: Diagnosis not present

## 2017-11-07 DIAGNOSIS — R260 Ataxic gait: Secondary | ICD-10-CM | POA: Diagnosis not present

## 2017-11-07 DIAGNOSIS — R451 Restlessness and agitation: Secondary | ICD-10-CM | POA: Diagnosis not present

## 2017-11-07 DIAGNOSIS — F419 Anxiety disorder, unspecified: Secondary | ICD-10-CM | POA: Diagnosis not present

## 2017-11-07 DIAGNOSIS — R531 Weakness: Secondary | ICD-10-CM | POA: Diagnosis not present

## 2017-11-07 DIAGNOSIS — G20 Parkinson's disease: Secondary | ICD-10-CM | POA: Diagnosis not present

## 2017-11-09 DIAGNOSIS — F419 Anxiety disorder, unspecified: Secondary | ICD-10-CM | POA: Diagnosis not present

## 2017-11-09 DIAGNOSIS — G20 Parkinson's disease: Secondary | ICD-10-CM | POA: Diagnosis not present

## 2017-11-09 DIAGNOSIS — R531 Weakness: Secondary | ICD-10-CM | POA: Diagnosis not present

## 2017-11-09 DIAGNOSIS — R296 Repeated falls: Secondary | ICD-10-CM | POA: Diagnosis not present

## 2017-11-09 DIAGNOSIS — R260 Ataxic gait: Secondary | ICD-10-CM | POA: Diagnosis not present

## 2017-11-09 DIAGNOSIS — R451 Restlessness and agitation: Secondary | ICD-10-CM | POA: Diagnosis not present

## 2017-11-12 DIAGNOSIS — G20 Parkinson's disease: Secondary | ICD-10-CM | POA: Diagnosis not present

## 2017-11-12 DIAGNOSIS — F419 Anxiety disorder, unspecified: Secondary | ICD-10-CM | POA: Diagnosis not present

## 2017-11-12 DIAGNOSIS — R531 Weakness: Secondary | ICD-10-CM | POA: Diagnosis not present

## 2017-11-12 DIAGNOSIS — R296 Repeated falls: Secondary | ICD-10-CM | POA: Diagnosis not present

## 2017-11-12 DIAGNOSIS — R451 Restlessness and agitation: Secondary | ICD-10-CM | POA: Diagnosis not present

## 2017-11-12 DIAGNOSIS — R260 Ataxic gait: Secondary | ICD-10-CM | POA: Diagnosis not present

## 2017-11-14 DIAGNOSIS — G20 Parkinson's disease: Secondary | ICD-10-CM | POA: Diagnosis not present

## 2017-11-14 DIAGNOSIS — F419 Anxiety disorder, unspecified: Secondary | ICD-10-CM | POA: Diagnosis not present

## 2017-11-14 DIAGNOSIS — R260 Ataxic gait: Secondary | ICD-10-CM | POA: Diagnosis not present

## 2017-11-14 DIAGNOSIS — R531 Weakness: Secondary | ICD-10-CM | POA: Diagnosis not present

## 2017-11-14 DIAGNOSIS — R296 Repeated falls: Secondary | ICD-10-CM | POA: Diagnosis not present

## 2017-11-14 DIAGNOSIS — R451 Restlessness and agitation: Secondary | ICD-10-CM | POA: Diagnosis not present

## 2017-11-16 DIAGNOSIS — R451 Restlessness and agitation: Secondary | ICD-10-CM | POA: Diagnosis not present

## 2017-11-16 DIAGNOSIS — R296 Repeated falls: Secondary | ICD-10-CM | POA: Diagnosis not present

## 2017-11-16 DIAGNOSIS — R531 Weakness: Secondary | ICD-10-CM | POA: Diagnosis not present

## 2017-11-16 DIAGNOSIS — R260 Ataxic gait: Secondary | ICD-10-CM | POA: Diagnosis not present

## 2017-11-16 DIAGNOSIS — G20 Parkinson's disease: Secondary | ICD-10-CM | POA: Diagnosis not present

## 2017-11-16 DIAGNOSIS — F419 Anxiety disorder, unspecified: Secondary | ICD-10-CM | POA: Diagnosis not present

## 2017-11-19 DIAGNOSIS — G20 Parkinson's disease: Secondary | ICD-10-CM | POA: Diagnosis not present

## 2017-11-19 DIAGNOSIS — F419 Anxiety disorder, unspecified: Secondary | ICD-10-CM | POA: Diagnosis not present

## 2017-11-19 DIAGNOSIS — R531 Weakness: Secondary | ICD-10-CM | POA: Diagnosis not present

## 2017-11-19 DIAGNOSIS — R296 Repeated falls: Secondary | ICD-10-CM | POA: Diagnosis not present

## 2017-11-19 DIAGNOSIS — R260 Ataxic gait: Secondary | ICD-10-CM | POA: Diagnosis not present

## 2017-11-19 DIAGNOSIS — R451 Restlessness and agitation: Secondary | ICD-10-CM | POA: Diagnosis not present

## 2017-11-21 DIAGNOSIS — R260 Ataxic gait: Secondary | ICD-10-CM | POA: Diagnosis not present

## 2017-11-21 DIAGNOSIS — R451 Restlessness and agitation: Secondary | ICD-10-CM | POA: Diagnosis not present

## 2017-11-21 DIAGNOSIS — G20 Parkinson's disease: Secondary | ICD-10-CM | POA: Diagnosis not present

## 2017-11-21 DIAGNOSIS — F419 Anxiety disorder, unspecified: Secondary | ICD-10-CM | POA: Diagnosis not present

## 2017-11-21 DIAGNOSIS — R296 Repeated falls: Secondary | ICD-10-CM | POA: Diagnosis not present

## 2017-11-21 DIAGNOSIS — R531 Weakness: Secondary | ICD-10-CM | POA: Diagnosis not present

## 2017-11-22 DIAGNOSIS — R531 Weakness: Secondary | ICD-10-CM | POA: Diagnosis not present

## 2017-11-22 DIAGNOSIS — R296 Repeated falls: Secondary | ICD-10-CM | POA: Diagnosis not present

## 2017-11-22 DIAGNOSIS — G20 Parkinson's disease: Secondary | ICD-10-CM | POA: Diagnosis not present

## 2017-11-22 DIAGNOSIS — R451 Restlessness and agitation: Secondary | ICD-10-CM | POA: Diagnosis not present

## 2017-11-22 DIAGNOSIS — R260 Ataxic gait: Secondary | ICD-10-CM | POA: Diagnosis not present

## 2017-11-22 DIAGNOSIS — F419 Anxiety disorder, unspecified: Secondary | ICD-10-CM | POA: Diagnosis not present

## 2017-11-23 DIAGNOSIS — R531 Weakness: Secondary | ICD-10-CM | POA: Diagnosis not present

## 2017-11-23 DIAGNOSIS — G20 Parkinson's disease: Secondary | ICD-10-CM | POA: Diagnosis not present

## 2017-11-23 DIAGNOSIS — F419 Anxiety disorder, unspecified: Secondary | ICD-10-CM | POA: Diagnosis not present

## 2017-11-23 DIAGNOSIS — R296 Repeated falls: Secondary | ICD-10-CM | POA: Diagnosis not present

## 2017-11-23 DIAGNOSIS — R451 Restlessness and agitation: Secondary | ICD-10-CM | POA: Diagnosis not present

## 2017-11-23 DIAGNOSIS — R260 Ataxic gait: Secondary | ICD-10-CM | POA: Diagnosis not present

## 2017-11-26 DIAGNOSIS — R531 Weakness: Secondary | ICD-10-CM | POA: Diagnosis not present

## 2017-11-26 DIAGNOSIS — F419 Anxiety disorder, unspecified: Secondary | ICD-10-CM | POA: Diagnosis not present

## 2017-11-26 DIAGNOSIS — G20 Parkinson's disease: Secondary | ICD-10-CM | POA: Diagnosis not present

## 2017-11-26 DIAGNOSIS — R296 Repeated falls: Secondary | ICD-10-CM | POA: Diagnosis not present

## 2017-11-26 DIAGNOSIS — R260 Ataxic gait: Secondary | ICD-10-CM | POA: Diagnosis not present

## 2017-11-26 DIAGNOSIS — R451 Restlessness and agitation: Secondary | ICD-10-CM | POA: Diagnosis not present

## 2017-11-28 DIAGNOSIS — R260 Ataxic gait: Secondary | ICD-10-CM | POA: Diagnosis not present

## 2017-11-28 DIAGNOSIS — R296 Repeated falls: Secondary | ICD-10-CM | POA: Diagnosis not present

## 2017-11-28 DIAGNOSIS — G20 Parkinson's disease: Secondary | ICD-10-CM | POA: Diagnosis not present

## 2017-11-28 DIAGNOSIS — R531 Weakness: Secondary | ICD-10-CM | POA: Diagnosis not present

## 2017-11-28 DIAGNOSIS — R451 Restlessness and agitation: Secondary | ICD-10-CM | POA: Diagnosis not present

## 2017-11-28 DIAGNOSIS — F419 Anxiety disorder, unspecified: Secondary | ICD-10-CM | POA: Diagnosis not present

## 2017-11-30 DIAGNOSIS — Z741 Need for assistance with personal care: Secondary | ICD-10-CM | POA: Diagnosis not present

## 2017-11-30 DIAGNOSIS — R531 Weakness: Secondary | ICD-10-CM | POA: Diagnosis not present

## 2017-11-30 DIAGNOSIS — R451 Restlessness and agitation: Secondary | ICD-10-CM | POA: Diagnosis not present

## 2017-11-30 DIAGNOSIS — R634 Abnormal weight loss: Secondary | ICD-10-CM | POA: Diagnosis not present

## 2017-11-30 DIAGNOSIS — F419 Anxiety disorder, unspecified: Secondary | ICD-10-CM | POA: Diagnosis not present

## 2017-11-30 DIAGNOSIS — Z66 Do not resuscitate: Secondary | ICD-10-CM | POA: Diagnosis not present

## 2017-11-30 DIAGNOSIS — R471 Dysarthria and anarthria: Secondary | ICD-10-CM | POA: Diagnosis not present

## 2017-11-30 DIAGNOSIS — R296 Repeated falls: Secondary | ICD-10-CM | POA: Diagnosis not present

## 2017-11-30 DIAGNOSIS — Z743 Need for continuous supervision: Secondary | ICD-10-CM | POA: Diagnosis not present

## 2017-11-30 DIAGNOSIS — R260 Ataxic gait: Secondary | ICD-10-CM | POA: Diagnosis not present

## 2017-11-30 DIAGNOSIS — G20 Parkinson's disease: Secondary | ICD-10-CM | POA: Diagnosis not present

## 2017-12-03 DIAGNOSIS — R260 Ataxic gait: Secondary | ICD-10-CM | POA: Diagnosis not present

## 2017-12-03 DIAGNOSIS — R451 Restlessness and agitation: Secondary | ICD-10-CM | POA: Diagnosis not present

## 2017-12-03 DIAGNOSIS — R296 Repeated falls: Secondary | ICD-10-CM | POA: Diagnosis not present

## 2017-12-03 DIAGNOSIS — F419 Anxiety disorder, unspecified: Secondary | ICD-10-CM | POA: Diagnosis not present

## 2017-12-03 DIAGNOSIS — R531 Weakness: Secondary | ICD-10-CM | POA: Diagnosis not present

## 2017-12-03 DIAGNOSIS — G20 Parkinson's disease: Secondary | ICD-10-CM | POA: Diagnosis not present

## 2017-12-04 DIAGNOSIS — R451 Restlessness and agitation: Secondary | ICD-10-CM | POA: Diagnosis not present

## 2017-12-04 DIAGNOSIS — R531 Weakness: Secondary | ICD-10-CM | POA: Diagnosis not present

## 2017-12-04 DIAGNOSIS — G20 Parkinson's disease: Secondary | ICD-10-CM | POA: Diagnosis not present

## 2017-12-04 DIAGNOSIS — F419 Anxiety disorder, unspecified: Secondary | ICD-10-CM | POA: Diagnosis not present

## 2017-12-04 DIAGNOSIS — R260 Ataxic gait: Secondary | ICD-10-CM | POA: Diagnosis not present

## 2017-12-04 DIAGNOSIS — R296 Repeated falls: Secondary | ICD-10-CM | POA: Diagnosis not present

## 2017-12-05 DIAGNOSIS — R531 Weakness: Secondary | ICD-10-CM | POA: Diagnosis not present

## 2017-12-05 DIAGNOSIS — R296 Repeated falls: Secondary | ICD-10-CM | POA: Diagnosis not present

## 2017-12-05 DIAGNOSIS — F419 Anxiety disorder, unspecified: Secondary | ICD-10-CM | POA: Diagnosis not present

## 2017-12-05 DIAGNOSIS — R260 Ataxic gait: Secondary | ICD-10-CM | POA: Diagnosis not present

## 2017-12-05 DIAGNOSIS — G20 Parkinson's disease: Secondary | ICD-10-CM | POA: Diagnosis not present

## 2017-12-05 DIAGNOSIS — R451 Restlessness and agitation: Secondary | ICD-10-CM | POA: Diagnosis not present

## 2017-12-07 DIAGNOSIS — R531 Weakness: Secondary | ICD-10-CM | POA: Diagnosis not present

## 2017-12-07 DIAGNOSIS — G20 Parkinson's disease: Secondary | ICD-10-CM | POA: Diagnosis not present

## 2017-12-07 DIAGNOSIS — F419 Anxiety disorder, unspecified: Secondary | ICD-10-CM | POA: Diagnosis not present

## 2017-12-07 DIAGNOSIS — R260 Ataxic gait: Secondary | ICD-10-CM | POA: Diagnosis not present

## 2017-12-07 DIAGNOSIS — R296 Repeated falls: Secondary | ICD-10-CM | POA: Diagnosis not present

## 2017-12-07 DIAGNOSIS — R451 Restlessness and agitation: Secondary | ICD-10-CM | POA: Diagnosis not present

## 2017-12-10 DIAGNOSIS — G20 Parkinson's disease: Secondary | ICD-10-CM | POA: Diagnosis not present

## 2017-12-10 DIAGNOSIS — R531 Weakness: Secondary | ICD-10-CM | POA: Diagnosis not present

## 2017-12-10 DIAGNOSIS — F419 Anxiety disorder, unspecified: Secondary | ICD-10-CM | POA: Diagnosis not present

## 2017-12-10 DIAGNOSIS — R260 Ataxic gait: Secondary | ICD-10-CM | POA: Diagnosis not present

## 2017-12-10 DIAGNOSIS — R296 Repeated falls: Secondary | ICD-10-CM | POA: Diagnosis not present

## 2017-12-10 DIAGNOSIS — R451 Restlessness and agitation: Secondary | ICD-10-CM | POA: Diagnosis not present

## 2017-12-11 DIAGNOSIS — R531 Weakness: Secondary | ICD-10-CM | POA: Diagnosis not present

## 2017-12-11 DIAGNOSIS — R296 Repeated falls: Secondary | ICD-10-CM | POA: Diagnosis not present

## 2017-12-11 DIAGNOSIS — R451 Restlessness and agitation: Secondary | ICD-10-CM | POA: Diagnosis not present

## 2017-12-11 DIAGNOSIS — G20 Parkinson's disease: Secondary | ICD-10-CM | POA: Diagnosis not present

## 2017-12-11 DIAGNOSIS — R260 Ataxic gait: Secondary | ICD-10-CM | POA: Diagnosis not present

## 2017-12-11 DIAGNOSIS — F419 Anxiety disorder, unspecified: Secondary | ICD-10-CM | POA: Diagnosis not present

## 2017-12-12 DIAGNOSIS — F419 Anxiety disorder, unspecified: Secondary | ICD-10-CM | POA: Diagnosis not present

## 2017-12-12 DIAGNOSIS — R296 Repeated falls: Secondary | ICD-10-CM | POA: Diagnosis not present

## 2017-12-12 DIAGNOSIS — R260 Ataxic gait: Secondary | ICD-10-CM | POA: Diagnosis not present

## 2017-12-12 DIAGNOSIS — G20 Parkinson's disease: Secondary | ICD-10-CM | POA: Diagnosis not present

## 2017-12-12 DIAGNOSIS — R531 Weakness: Secondary | ICD-10-CM | POA: Diagnosis not present

## 2017-12-12 DIAGNOSIS — R451 Restlessness and agitation: Secondary | ICD-10-CM | POA: Diagnosis not present

## 2017-12-14 DIAGNOSIS — R260 Ataxic gait: Secondary | ICD-10-CM | POA: Diagnosis not present

## 2017-12-14 DIAGNOSIS — F419 Anxiety disorder, unspecified: Secondary | ICD-10-CM | POA: Diagnosis not present

## 2017-12-14 DIAGNOSIS — R451 Restlessness and agitation: Secondary | ICD-10-CM | POA: Diagnosis not present

## 2017-12-14 DIAGNOSIS — G20 Parkinson's disease: Secondary | ICD-10-CM | POA: Diagnosis not present

## 2017-12-14 DIAGNOSIS — R296 Repeated falls: Secondary | ICD-10-CM | POA: Diagnosis not present

## 2017-12-14 DIAGNOSIS — R531 Weakness: Secondary | ICD-10-CM | POA: Diagnosis not present

## 2017-12-18 DIAGNOSIS — R296 Repeated falls: Secondary | ICD-10-CM | POA: Diagnosis not present

## 2017-12-18 DIAGNOSIS — F419 Anxiety disorder, unspecified: Secondary | ICD-10-CM | POA: Diagnosis not present

## 2017-12-18 DIAGNOSIS — R451 Restlessness and agitation: Secondary | ICD-10-CM | POA: Diagnosis not present

## 2017-12-18 DIAGNOSIS — R260 Ataxic gait: Secondary | ICD-10-CM | POA: Diagnosis not present

## 2017-12-18 DIAGNOSIS — G20 Parkinson's disease: Secondary | ICD-10-CM | POA: Diagnosis not present

## 2017-12-18 DIAGNOSIS — R531 Weakness: Secondary | ICD-10-CM | POA: Diagnosis not present

## 2017-12-19 DIAGNOSIS — R451 Restlessness and agitation: Secondary | ICD-10-CM | POA: Diagnosis not present

## 2017-12-19 DIAGNOSIS — G20 Parkinson's disease: Secondary | ICD-10-CM | POA: Diagnosis not present

## 2017-12-19 DIAGNOSIS — R296 Repeated falls: Secondary | ICD-10-CM | POA: Diagnosis not present

## 2017-12-19 DIAGNOSIS — R260 Ataxic gait: Secondary | ICD-10-CM | POA: Diagnosis not present

## 2017-12-19 DIAGNOSIS — F419 Anxiety disorder, unspecified: Secondary | ICD-10-CM | POA: Diagnosis not present

## 2017-12-19 DIAGNOSIS — R531 Weakness: Secondary | ICD-10-CM | POA: Diagnosis not present

## 2017-12-21 DIAGNOSIS — R260 Ataxic gait: Secondary | ICD-10-CM | POA: Diagnosis not present

## 2017-12-21 DIAGNOSIS — R451 Restlessness and agitation: Secondary | ICD-10-CM | POA: Diagnosis not present

## 2017-12-21 DIAGNOSIS — G20 Parkinson's disease: Secondary | ICD-10-CM | POA: Diagnosis not present

## 2017-12-21 DIAGNOSIS — R296 Repeated falls: Secondary | ICD-10-CM | POA: Diagnosis not present

## 2017-12-21 DIAGNOSIS — F419 Anxiety disorder, unspecified: Secondary | ICD-10-CM | POA: Diagnosis not present

## 2017-12-21 DIAGNOSIS — R531 Weakness: Secondary | ICD-10-CM | POA: Diagnosis not present

## 2017-12-24 DIAGNOSIS — R296 Repeated falls: Secondary | ICD-10-CM | POA: Diagnosis not present

## 2017-12-24 DIAGNOSIS — G20 Parkinson's disease: Secondary | ICD-10-CM | POA: Diagnosis not present

## 2017-12-24 DIAGNOSIS — R260 Ataxic gait: Secondary | ICD-10-CM | POA: Diagnosis not present

## 2017-12-24 DIAGNOSIS — R451 Restlessness and agitation: Secondary | ICD-10-CM | POA: Diagnosis not present

## 2017-12-24 DIAGNOSIS — F419 Anxiety disorder, unspecified: Secondary | ICD-10-CM | POA: Diagnosis not present

## 2017-12-24 DIAGNOSIS — R531 Weakness: Secondary | ICD-10-CM | POA: Diagnosis not present

## 2017-12-25 DIAGNOSIS — R531 Weakness: Secondary | ICD-10-CM | POA: Diagnosis not present

## 2017-12-25 DIAGNOSIS — R296 Repeated falls: Secondary | ICD-10-CM | POA: Diagnosis not present

## 2017-12-25 DIAGNOSIS — F419 Anxiety disorder, unspecified: Secondary | ICD-10-CM | POA: Diagnosis not present

## 2017-12-25 DIAGNOSIS — R260 Ataxic gait: Secondary | ICD-10-CM | POA: Diagnosis not present

## 2017-12-25 DIAGNOSIS — G20 Parkinson's disease: Secondary | ICD-10-CM | POA: Diagnosis not present

## 2017-12-25 DIAGNOSIS — R451 Restlessness and agitation: Secondary | ICD-10-CM | POA: Diagnosis not present

## 2017-12-26 DIAGNOSIS — R260 Ataxic gait: Secondary | ICD-10-CM | POA: Diagnosis not present

## 2017-12-26 DIAGNOSIS — G20 Parkinson's disease: Secondary | ICD-10-CM | POA: Diagnosis not present

## 2017-12-26 DIAGNOSIS — R296 Repeated falls: Secondary | ICD-10-CM | POA: Diagnosis not present

## 2017-12-26 DIAGNOSIS — R531 Weakness: Secondary | ICD-10-CM | POA: Diagnosis not present

## 2017-12-26 DIAGNOSIS — F419 Anxiety disorder, unspecified: Secondary | ICD-10-CM | POA: Diagnosis not present

## 2017-12-26 DIAGNOSIS — R451 Restlessness and agitation: Secondary | ICD-10-CM | POA: Diagnosis not present

## 2017-12-28 DIAGNOSIS — R451 Restlessness and agitation: Secondary | ICD-10-CM | POA: Diagnosis not present

## 2017-12-28 DIAGNOSIS — R531 Weakness: Secondary | ICD-10-CM | POA: Diagnosis not present

## 2017-12-28 DIAGNOSIS — F419 Anxiety disorder, unspecified: Secondary | ICD-10-CM | POA: Diagnosis not present

## 2017-12-28 DIAGNOSIS — G20 Parkinson's disease: Secondary | ICD-10-CM | POA: Diagnosis not present

## 2017-12-28 DIAGNOSIS — R296 Repeated falls: Secondary | ICD-10-CM | POA: Diagnosis not present

## 2017-12-28 DIAGNOSIS — R260 Ataxic gait: Secondary | ICD-10-CM | POA: Diagnosis not present

## 2017-12-31 DIAGNOSIS — R531 Weakness: Secondary | ICD-10-CM | POA: Diagnosis not present

## 2017-12-31 DIAGNOSIS — Z741 Need for assistance with personal care: Secondary | ICD-10-CM | POA: Diagnosis not present

## 2017-12-31 DIAGNOSIS — R451 Restlessness and agitation: Secondary | ICD-10-CM | POA: Diagnosis not present

## 2017-12-31 DIAGNOSIS — R634 Abnormal weight loss: Secondary | ICD-10-CM | POA: Diagnosis not present

## 2017-12-31 DIAGNOSIS — Z66 Do not resuscitate: Secondary | ICD-10-CM | POA: Diagnosis not present

## 2017-12-31 DIAGNOSIS — R260 Ataxic gait: Secondary | ICD-10-CM | POA: Diagnosis not present

## 2017-12-31 DIAGNOSIS — G20 Parkinson's disease: Secondary | ICD-10-CM | POA: Diagnosis not present

## 2017-12-31 DIAGNOSIS — F419 Anxiety disorder, unspecified: Secondary | ICD-10-CM | POA: Diagnosis not present

## 2017-12-31 DIAGNOSIS — R471 Dysarthria and anarthria: Secondary | ICD-10-CM | POA: Diagnosis not present

## 2017-12-31 DIAGNOSIS — Z743 Need for continuous supervision: Secondary | ICD-10-CM | POA: Diagnosis not present

## 2017-12-31 DIAGNOSIS — R296 Repeated falls: Secondary | ICD-10-CM | POA: Diagnosis not present

## 2018-01-01 DIAGNOSIS — R296 Repeated falls: Secondary | ICD-10-CM | POA: Diagnosis not present

## 2018-01-01 DIAGNOSIS — G20 Parkinson's disease: Secondary | ICD-10-CM | POA: Diagnosis not present

## 2018-01-01 DIAGNOSIS — F419 Anxiety disorder, unspecified: Secondary | ICD-10-CM | POA: Diagnosis not present

## 2018-01-01 DIAGNOSIS — R531 Weakness: Secondary | ICD-10-CM | POA: Diagnosis not present

## 2018-01-01 DIAGNOSIS — R451 Restlessness and agitation: Secondary | ICD-10-CM | POA: Diagnosis not present

## 2018-01-01 DIAGNOSIS — R260 Ataxic gait: Secondary | ICD-10-CM | POA: Diagnosis not present

## 2018-01-02 DIAGNOSIS — F419 Anxiety disorder, unspecified: Secondary | ICD-10-CM | POA: Diagnosis not present

## 2018-01-02 DIAGNOSIS — R451 Restlessness and agitation: Secondary | ICD-10-CM | POA: Diagnosis not present

## 2018-01-02 DIAGNOSIS — G20 Parkinson's disease: Secondary | ICD-10-CM | POA: Diagnosis not present

## 2018-01-02 DIAGNOSIS — R260 Ataxic gait: Secondary | ICD-10-CM | POA: Diagnosis not present

## 2018-01-02 DIAGNOSIS — R296 Repeated falls: Secondary | ICD-10-CM | POA: Diagnosis not present

## 2018-01-02 DIAGNOSIS — R531 Weakness: Secondary | ICD-10-CM | POA: Diagnosis not present

## 2018-01-04 DIAGNOSIS — G20 Parkinson's disease: Secondary | ICD-10-CM | POA: Diagnosis not present

## 2018-01-04 DIAGNOSIS — R531 Weakness: Secondary | ICD-10-CM | POA: Diagnosis not present

## 2018-01-04 DIAGNOSIS — R451 Restlessness and agitation: Secondary | ICD-10-CM | POA: Diagnosis not present

## 2018-01-04 DIAGNOSIS — F419 Anxiety disorder, unspecified: Secondary | ICD-10-CM | POA: Diagnosis not present

## 2018-01-04 DIAGNOSIS — R260 Ataxic gait: Secondary | ICD-10-CM | POA: Diagnosis not present

## 2018-01-04 DIAGNOSIS — R296 Repeated falls: Secondary | ICD-10-CM | POA: Diagnosis not present

## 2018-01-07 DIAGNOSIS — R451 Restlessness and agitation: Secondary | ICD-10-CM | POA: Diagnosis not present

## 2018-01-07 DIAGNOSIS — R260 Ataxic gait: Secondary | ICD-10-CM | POA: Diagnosis not present

## 2018-01-07 DIAGNOSIS — R296 Repeated falls: Secondary | ICD-10-CM | POA: Diagnosis not present

## 2018-01-07 DIAGNOSIS — F419 Anxiety disorder, unspecified: Secondary | ICD-10-CM | POA: Diagnosis not present

## 2018-01-07 DIAGNOSIS — R531 Weakness: Secondary | ICD-10-CM | POA: Diagnosis not present

## 2018-01-07 DIAGNOSIS — G20 Parkinson's disease: Secondary | ICD-10-CM | POA: Diagnosis not present

## 2018-01-09 DIAGNOSIS — R531 Weakness: Secondary | ICD-10-CM | POA: Diagnosis not present

## 2018-01-09 DIAGNOSIS — G20 Parkinson's disease: Secondary | ICD-10-CM | POA: Diagnosis not present

## 2018-01-09 DIAGNOSIS — R260 Ataxic gait: Secondary | ICD-10-CM | POA: Diagnosis not present

## 2018-01-09 DIAGNOSIS — R296 Repeated falls: Secondary | ICD-10-CM | POA: Diagnosis not present

## 2018-01-09 DIAGNOSIS — F419 Anxiety disorder, unspecified: Secondary | ICD-10-CM | POA: Diagnosis not present

## 2018-01-09 DIAGNOSIS — R451 Restlessness and agitation: Secondary | ICD-10-CM | POA: Diagnosis not present

## 2018-01-11 DIAGNOSIS — R531 Weakness: Secondary | ICD-10-CM | POA: Diagnosis not present

## 2018-01-11 DIAGNOSIS — R451 Restlessness and agitation: Secondary | ICD-10-CM | POA: Diagnosis not present

## 2018-01-11 DIAGNOSIS — R296 Repeated falls: Secondary | ICD-10-CM | POA: Diagnosis not present

## 2018-01-11 DIAGNOSIS — R260 Ataxic gait: Secondary | ICD-10-CM | POA: Diagnosis not present

## 2018-01-11 DIAGNOSIS — G20 Parkinson's disease: Secondary | ICD-10-CM | POA: Diagnosis not present

## 2018-01-11 DIAGNOSIS — F419 Anxiety disorder, unspecified: Secondary | ICD-10-CM | POA: Diagnosis not present

## 2018-01-14 DIAGNOSIS — R531 Weakness: Secondary | ICD-10-CM | POA: Diagnosis not present

## 2018-01-14 DIAGNOSIS — R260 Ataxic gait: Secondary | ICD-10-CM | POA: Diagnosis not present

## 2018-01-14 DIAGNOSIS — G20 Parkinson's disease: Secondary | ICD-10-CM | POA: Diagnosis not present

## 2018-01-14 DIAGNOSIS — R451 Restlessness and agitation: Secondary | ICD-10-CM | POA: Diagnosis not present

## 2018-01-14 DIAGNOSIS — F419 Anxiety disorder, unspecified: Secondary | ICD-10-CM | POA: Diagnosis not present

## 2018-01-14 DIAGNOSIS — R296 Repeated falls: Secondary | ICD-10-CM | POA: Diagnosis not present

## 2018-01-15 DIAGNOSIS — R531 Weakness: Secondary | ICD-10-CM | POA: Diagnosis not present

## 2018-01-15 DIAGNOSIS — F419 Anxiety disorder, unspecified: Secondary | ICD-10-CM | POA: Diagnosis not present

## 2018-01-15 DIAGNOSIS — R296 Repeated falls: Secondary | ICD-10-CM | POA: Diagnosis not present

## 2018-01-15 DIAGNOSIS — G20 Parkinson's disease: Secondary | ICD-10-CM | POA: Diagnosis not present

## 2018-01-15 DIAGNOSIS — R260 Ataxic gait: Secondary | ICD-10-CM | POA: Diagnosis not present

## 2018-01-15 DIAGNOSIS — R451 Restlessness and agitation: Secondary | ICD-10-CM | POA: Diagnosis not present

## 2018-01-16 DIAGNOSIS — R260 Ataxic gait: Secondary | ICD-10-CM | POA: Diagnosis not present

## 2018-01-16 DIAGNOSIS — G20 Parkinson's disease: Secondary | ICD-10-CM | POA: Diagnosis not present

## 2018-01-16 DIAGNOSIS — R296 Repeated falls: Secondary | ICD-10-CM | POA: Diagnosis not present

## 2018-01-16 DIAGNOSIS — F419 Anxiety disorder, unspecified: Secondary | ICD-10-CM | POA: Diagnosis not present

## 2018-01-16 DIAGNOSIS — R531 Weakness: Secondary | ICD-10-CM | POA: Diagnosis not present

## 2018-01-16 DIAGNOSIS — R451 Restlessness and agitation: Secondary | ICD-10-CM | POA: Diagnosis not present

## 2018-01-17 DIAGNOSIS — G20 Parkinson's disease: Secondary | ICD-10-CM | POA: Diagnosis not present

## 2018-01-17 DIAGNOSIS — R260 Ataxic gait: Secondary | ICD-10-CM | POA: Diagnosis not present

## 2018-01-17 DIAGNOSIS — F419 Anxiety disorder, unspecified: Secondary | ICD-10-CM | POA: Diagnosis not present

## 2018-01-17 DIAGNOSIS — R531 Weakness: Secondary | ICD-10-CM | POA: Diagnosis not present

## 2018-01-17 DIAGNOSIS — R296 Repeated falls: Secondary | ICD-10-CM | POA: Diagnosis not present

## 2018-01-17 DIAGNOSIS — R451 Restlessness and agitation: Secondary | ICD-10-CM | POA: Diagnosis not present

## 2018-01-22 DIAGNOSIS — R531 Weakness: Secondary | ICD-10-CM | POA: Diagnosis not present

## 2018-01-22 DIAGNOSIS — F419 Anxiety disorder, unspecified: Secondary | ICD-10-CM | POA: Diagnosis not present

## 2018-01-22 DIAGNOSIS — R296 Repeated falls: Secondary | ICD-10-CM | POA: Diagnosis not present

## 2018-01-22 DIAGNOSIS — R260 Ataxic gait: Secondary | ICD-10-CM | POA: Diagnosis not present

## 2018-01-22 DIAGNOSIS — G20 Parkinson's disease: Secondary | ICD-10-CM | POA: Diagnosis not present

## 2018-01-22 DIAGNOSIS — R451 Restlessness and agitation: Secondary | ICD-10-CM | POA: Diagnosis not present

## 2018-01-23 DIAGNOSIS — R296 Repeated falls: Secondary | ICD-10-CM | POA: Diagnosis not present

## 2018-01-23 DIAGNOSIS — F419 Anxiety disorder, unspecified: Secondary | ICD-10-CM | POA: Diagnosis not present

## 2018-01-23 DIAGNOSIS — R260 Ataxic gait: Secondary | ICD-10-CM | POA: Diagnosis not present

## 2018-01-23 DIAGNOSIS — R531 Weakness: Secondary | ICD-10-CM | POA: Diagnosis not present

## 2018-01-23 DIAGNOSIS — R451 Restlessness and agitation: Secondary | ICD-10-CM | POA: Diagnosis not present

## 2018-01-23 DIAGNOSIS — G20 Parkinson's disease: Secondary | ICD-10-CM | POA: Diagnosis not present

## 2018-01-25 DIAGNOSIS — F419 Anxiety disorder, unspecified: Secondary | ICD-10-CM | POA: Diagnosis not present

## 2018-01-25 DIAGNOSIS — R260 Ataxic gait: Secondary | ICD-10-CM | POA: Diagnosis not present

## 2018-01-25 DIAGNOSIS — R296 Repeated falls: Secondary | ICD-10-CM | POA: Diagnosis not present

## 2018-01-25 DIAGNOSIS — R451 Restlessness and agitation: Secondary | ICD-10-CM | POA: Diagnosis not present

## 2018-01-25 DIAGNOSIS — G20 Parkinson's disease: Secondary | ICD-10-CM | POA: Diagnosis not present

## 2018-01-25 DIAGNOSIS — R531 Weakness: Secondary | ICD-10-CM | POA: Diagnosis not present

## 2018-01-26 DIAGNOSIS — R451 Restlessness and agitation: Secondary | ICD-10-CM | POA: Diagnosis not present

## 2018-01-26 DIAGNOSIS — R260 Ataxic gait: Secondary | ICD-10-CM | POA: Diagnosis not present

## 2018-01-26 DIAGNOSIS — F419 Anxiety disorder, unspecified: Secondary | ICD-10-CM | POA: Diagnosis not present

## 2018-01-26 DIAGNOSIS — R531 Weakness: Secondary | ICD-10-CM | POA: Diagnosis not present

## 2018-01-26 DIAGNOSIS — G20 Parkinson's disease: Secondary | ICD-10-CM | POA: Diagnosis not present

## 2018-01-26 DIAGNOSIS — R296 Repeated falls: Secondary | ICD-10-CM | POA: Diagnosis not present

## 2018-01-28 DIAGNOSIS — G20 Parkinson's disease: Secondary | ICD-10-CM | POA: Diagnosis not present

## 2018-01-28 DIAGNOSIS — R260 Ataxic gait: Secondary | ICD-10-CM | POA: Diagnosis not present

## 2018-01-28 DIAGNOSIS — R531 Weakness: Secondary | ICD-10-CM | POA: Diagnosis not present

## 2018-01-28 DIAGNOSIS — R296 Repeated falls: Secondary | ICD-10-CM | POA: Diagnosis not present

## 2018-01-28 DIAGNOSIS — R451 Restlessness and agitation: Secondary | ICD-10-CM | POA: Diagnosis not present

## 2018-01-28 DIAGNOSIS — F419 Anxiety disorder, unspecified: Secondary | ICD-10-CM | POA: Diagnosis not present

## 2018-01-29 DIAGNOSIS — G20 Parkinson's disease: Secondary | ICD-10-CM | POA: Diagnosis not present

## 2018-01-29 DIAGNOSIS — F419 Anxiety disorder, unspecified: Secondary | ICD-10-CM | POA: Diagnosis not present

## 2018-01-29 DIAGNOSIS — R451 Restlessness and agitation: Secondary | ICD-10-CM | POA: Diagnosis not present

## 2018-01-29 DIAGNOSIS — R531 Weakness: Secondary | ICD-10-CM | POA: Diagnosis not present

## 2018-01-29 DIAGNOSIS — R260 Ataxic gait: Secondary | ICD-10-CM | POA: Diagnosis not present

## 2018-01-29 DIAGNOSIS — R296 Repeated falls: Secondary | ICD-10-CM | POA: Diagnosis not present

## 2018-01-30 DIAGNOSIS — G20 Parkinson's disease: Secondary | ICD-10-CM | POA: Diagnosis not present

## 2018-02-01 DIAGNOSIS — G20 Parkinson's disease: Secondary | ICD-10-CM | POA: Diagnosis not present

## 2018-02-04 DIAGNOSIS — G20 Parkinson's disease: Secondary | ICD-10-CM | POA: Diagnosis not present

## 2018-02-05 DIAGNOSIS — G20 Parkinson's disease: Secondary | ICD-10-CM | POA: Diagnosis not present

## 2018-02-06 DIAGNOSIS — G20 Parkinson's disease: Secondary | ICD-10-CM | POA: Diagnosis not present

## 2018-02-08 DIAGNOSIS — G20 Parkinson's disease: Secondary | ICD-10-CM | POA: Diagnosis not present

## 2018-02-11 DIAGNOSIS — G20 Parkinson's disease: Secondary | ICD-10-CM | POA: Diagnosis not present

## 2018-02-12 DIAGNOSIS — G20 Parkinson's disease: Secondary | ICD-10-CM | POA: Diagnosis not present

## 2018-02-13 DIAGNOSIS — G20 Parkinson's disease: Secondary | ICD-10-CM | POA: Diagnosis not present

## 2018-02-15 DIAGNOSIS — G20 Parkinson's disease: Secondary | ICD-10-CM | POA: Diagnosis not present

## 2018-02-18 DIAGNOSIS — G20 Parkinson's disease: Secondary | ICD-10-CM | POA: Diagnosis not present

## 2018-02-19 DIAGNOSIS — G20 Parkinson's disease: Secondary | ICD-10-CM | POA: Diagnosis not present

## 2018-02-20 DIAGNOSIS — G20 Parkinson's disease: Secondary | ICD-10-CM | POA: Diagnosis not present

## 2018-02-22 DIAGNOSIS — G20 Parkinson's disease: Secondary | ICD-10-CM | POA: Diagnosis not present

## 2018-02-25 DIAGNOSIS — G20 Parkinson's disease: Secondary | ICD-10-CM | POA: Diagnosis not present

## 2018-02-26 DIAGNOSIS — G20 Parkinson's disease: Secondary | ICD-10-CM | POA: Diagnosis not present

## 2018-02-27 DIAGNOSIS — G20 Parkinson's disease: Secondary | ICD-10-CM | POA: Diagnosis not present

## 2018-02-28 DIAGNOSIS — G20 Parkinson's disease: Secondary | ICD-10-CM | POA: Diagnosis not present

## 2018-03-01 DIAGNOSIS — G20 Parkinson's disease: Secondary | ICD-10-CM | POA: Diagnosis not present

## 2018-03-02 DIAGNOSIS — R634 Abnormal weight loss: Secondary | ICD-10-CM | POA: Diagnosis not present

## 2018-03-02 DIAGNOSIS — Z743 Need for continuous supervision: Secondary | ICD-10-CM | POA: Diagnosis not present

## 2018-03-02 DIAGNOSIS — Z66 Do not resuscitate: Secondary | ICD-10-CM | POA: Diagnosis not present

## 2018-03-02 DIAGNOSIS — R451 Restlessness and agitation: Secondary | ICD-10-CM | POA: Diagnosis not present

## 2018-03-02 DIAGNOSIS — F419 Anxiety disorder, unspecified: Secondary | ICD-10-CM | POA: Diagnosis not present

## 2018-03-02 DIAGNOSIS — R471 Dysarthria and anarthria: Secondary | ICD-10-CM | POA: Diagnosis not present

## 2018-03-02 DIAGNOSIS — R296 Repeated falls: Secondary | ICD-10-CM | POA: Diagnosis not present

## 2018-03-02 DIAGNOSIS — G20 Parkinson's disease: Secondary | ICD-10-CM | POA: Diagnosis not present

## 2018-03-02 DIAGNOSIS — Z741 Need for assistance with personal care: Secondary | ICD-10-CM | POA: Diagnosis not present

## 2018-03-02 DIAGNOSIS — R260 Ataxic gait: Secondary | ICD-10-CM | POA: Diagnosis not present

## 2018-03-02 DIAGNOSIS — R531 Weakness: Secondary | ICD-10-CM | POA: Diagnosis not present

## 2018-03-04 DIAGNOSIS — R260 Ataxic gait: Secondary | ICD-10-CM | POA: Diagnosis not present

## 2018-03-04 DIAGNOSIS — R531 Weakness: Secondary | ICD-10-CM | POA: Diagnosis not present

## 2018-03-04 DIAGNOSIS — G20 Parkinson's disease: Secondary | ICD-10-CM | POA: Diagnosis not present

## 2018-03-04 DIAGNOSIS — F419 Anxiety disorder, unspecified: Secondary | ICD-10-CM | POA: Diagnosis not present

## 2018-03-04 DIAGNOSIS — R451 Restlessness and agitation: Secondary | ICD-10-CM | POA: Diagnosis not present

## 2018-03-04 DIAGNOSIS — R296 Repeated falls: Secondary | ICD-10-CM | POA: Diagnosis not present

## 2018-03-05 DIAGNOSIS — G20 Parkinson's disease: Secondary | ICD-10-CM | POA: Diagnosis not present

## 2018-03-05 DIAGNOSIS — F419 Anxiety disorder, unspecified: Secondary | ICD-10-CM | POA: Diagnosis not present

## 2018-03-05 DIAGNOSIS — R260 Ataxic gait: Secondary | ICD-10-CM | POA: Diagnosis not present

## 2018-03-05 DIAGNOSIS — R531 Weakness: Secondary | ICD-10-CM | POA: Diagnosis not present

## 2018-03-05 DIAGNOSIS — R451 Restlessness and agitation: Secondary | ICD-10-CM | POA: Diagnosis not present

## 2018-03-05 DIAGNOSIS — R296 Repeated falls: Secondary | ICD-10-CM | POA: Diagnosis not present

## 2018-03-06 DIAGNOSIS — R451 Restlessness and agitation: Secondary | ICD-10-CM | POA: Diagnosis not present

## 2018-03-06 DIAGNOSIS — R260 Ataxic gait: Secondary | ICD-10-CM | POA: Diagnosis not present

## 2018-03-06 DIAGNOSIS — F419 Anxiety disorder, unspecified: Secondary | ICD-10-CM | POA: Diagnosis not present

## 2018-03-06 DIAGNOSIS — G20 Parkinson's disease: Secondary | ICD-10-CM | POA: Diagnosis not present

## 2018-03-06 DIAGNOSIS — R531 Weakness: Secondary | ICD-10-CM | POA: Diagnosis not present

## 2018-03-06 DIAGNOSIS — R296 Repeated falls: Secondary | ICD-10-CM | POA: Diagnosis not present

## 2018-03-08 DIAGNOSIS — R451 Restlessness and agitation: Secondary | ICD-10-CM | POA: Diagnosis not present

## 2018-03-08 DIAGNOSIS — R531 Weakness: Secondary | ICD-10-CM | POA: Diagnosis not present

## 2018-03-08 DIAGNOSIS — R296 Repeated falls: Secondary | ICD-10-CM | POA: Diagnosis not present

## 2018-03-08 DIAGNOSIS — G20 Parkinson's disease: Secondary | ICD-10-CM | POA: Diagnosis not present

## 2018-03-08 DIAGNOSIS — R260 Ataxic gait: Secondary | ICD-10-CM | POA: Diagnosis not present

## 2018-03-08 DIAGNOSIS — F419 Anxiety disorder, unspecified: Secondary | ICD-10-CM | POA: Diagnosis not present

## 2018-03-11 DIAGNOSIS — R451 Restlessness and agitation: Secondary | ICD-10-CM | POA: Diagnosis not present

## 2018-03-11 DIAGNOSIS — R531 Weakness: Secondary | ICD-10-CM | POA: Diagnosis not present

## 2018-03-11 DIAGNOSIS — R260 Ataxic gait: Secondary | ICD-10-CM | POA: Diagnosis not present

## 2018-03-11 DIAGNOSIS — R296 Repeated falls: Secondary | ICD-10-CM | POA: Diagnosis not present

## 2018-03-11 DIAGNOSIS — F419 Anxiety disorder, unspecified: Secondary | ICD-10-CM | POA: Diagnosis not present

## 2018-03-11 DIAGNOSIS — G20 Parkinson's disease: Secondary | ICD-10-CM | POA: Diagnosis not present

## 2018-03-12 DIAGNOSIS — G20 Parkinson's disease: Secondary | ICD-10-CM | POA: Diagnosis not present

## 2018-03-12 DIAGNOSIS — R296 Repeated falls: Secondary | ICD-10-CM | POA: Diagnosis not present

## 2018-03-12 DIAGNOSIS — R451 Restlessness and agitation: Secondary | ICD-10-CM | POA: Diagnosis not present

## 2018-03-12 DIAGNOSIS — F419 Anxiety disorder, unspecified: Secondary | ICD-10-CM | POA: Diagnosis not present

## 2018-03-12 DIAGNOSIS — R260 Ataxic gait: Secondary | ICD-10-CM | POA: Diagnosis not present

## 2018-03-12 DIAGNOSIS — R531 Weakness: Secondary | ICD-10-CM | POA: Diagnosis not present

## 2018-03-13 DIAGNOSIS — R451 Restlessness and agitation: Secondary | ICD-10-CM | POA: Diagnosis not present

## 2018-03-13 DIAGNOSIS — R531 Weakness: Secondary | ICD-10-CM | POA: Diagnosis not present

## 2018-03-13 DIAGNOSIS — F419 Anxiety disorder, unspecified: Secondary | ICD-10-CM | POA: Diagnosis not present

## 2018-03-13 DIAGNOSIS — R296 Repeated falls: Secondary | ICD-10-CM | POA: Diagnosis not present

## 2018-03-13 DIAGNOSIS — G20 Parkinson's disease: Secondary | ICD-10-CM | POA: Diagnosis not present

## 2018-03-13 DIAGNOSIS — R260 Ataxic gait: Secondary | ICD-10-CM | POA: Diagnosis not present

## 2018-03-15 DIAGNOSIS — R296 Repeated falls: Secondary | ICD-10-CM | POA: Diagnosis not present

## 2018-03-15 DIAGNOSIS — F419 Anxiety disorder, unspecified: Secondary | ICD-10-CM | POA: Diagnosis not present

## 2018-03-15 DIAGNOSIS — G20 Parkinson's disease: Secondary | ICD-10-CM | POA: Diagnosis not present

## 2018-03-15 DIAGNOSIS — R260 Ataxic gait: Secondary | ICD-10-CM | POA: Diagnosis not present

## 2018-03-15 DIAGNOSIS — R531 Weakness: Secondary | ICD-10-CM | POA: Diagnosis not present

## 2018-03-15 DIAGNOSIS — R451 Restlessness and agitation: Secondary | ICD-10-CM | POA: Diagnosis not present

## 2018-03-18 DIAGNOSIS — G20 Parkinson's disease: Secondary | ICD-10-CM | POA: Diagnosis not present

## 2018-03-18 DIAGNOSIS — F419 Anxiety disorder, unspecified: Secondary | ICD-10-CM | POA: Diagnosis not present

## 2018-03-18 DIAGNOSIS — R451 Restlessness and agitation: Secondary | ICD-10-CM | POA: Diagnosis not present

## 2018-03-18 DIAGNOSIS — R531 Weakness: Secondary | ICD-10-CM | POA: Diagnosis not present

## 2018-03-18 DIAGNOSIS — R296 Repeated falls: Secondary | ICD-10-CM | POA: Diagnosis not present

## 2018-03-18 DIAGNOSIS — R260 Ataxic gait: Secondary | ICD-10-CM | POA: Diagnosis not present

## 2018-03-20 DIAGNOSIS — R531 Weakness: Secondary | ICD-10-CM | POA: Diagnosis not present

## 2018-03-20 DIAGNOSIS — R451 Restlessness and agitation: Secondary | ICD-10-CM | POA: Diagnosis not present

## 2018-03-20 DIAGNOSIS — R260 Ataxic gait: Secondary | ICD-10-CM | POA: Diagnosis not present

## 2018-03-20 DIAGNOSIS — F419 Anxiety disorder, unspecified: Secondary | ICD-10-CM | POA: Diagnosis not present

## 2018-03-20 DIAGNOSIS — R296 Repeated falls: Secondary | ICD-10-CM | POA: Diagnosis not present

## 2018-03-20 DIAGNOSIS — G20 Parkinson's disease: Secondary | ICD-10-CM | POA: Diagnosis not present

## 2018-03-22 DIAGNOSIS — R451 Restlessness and agitation: Secondary | ICD-10-CM | POA: Diagnosis not present

## 2018-03-22 DIAGNOSIS — R531 Weakness: Secondary | ICD-10-CM | POA: Diagnosis not present

## 2018-03-22 DIAGNOSIS — F419 Anxiety disorder, unspecified: Secondary | ICD-10-CM | POA: Diagnosis not present

## 2018-03-22 DIAGNOSIS — R260 Ataxic gait: Secondary | ICD-10-CM | POA: Diagnosis not present

## 2018-03-22 DIAGNOSIS — G20 Parkinson's disease: Secondary | ICD-10-CM | POA: Diagnosis not present

## 2018-03-22 DIAGNOSIS — R296 Repeated falls: Secondary | ICD-10-CM | POA: Diagnosis not present

## 2018-03-25 DIAGNOSIS — R260 Ataxic gait: Secondary | ICD-10-CM | POA: Diagnosis not present

## 2018-03-25 DIAGNOSIS — R451 Restlessness and agitation: Secondary | ICD-10-CM | POA: Diagnosis not present

## 2018-03-25 DIAGNOSIS — G20 Parkinson's disease: Secondary | ICD-10-CM | POA: Diagnosis not present

## 2018-03-25 DIAGNOSIS — F419 Anxiety disorder, unspecified: Secondary | ICD-10-CM | POA: Diagnosis not present

## 2018-03-25 DIAGNOSIS — R531 Weakness: Secondary | ICD-10-CM | POA: Diagnosis not present

## 2018-03-25 DIAGNOSIS — R296 Repeated falls: Secondary | ICD-10-CM | POA: Diagnosis not present

## 2018-03-26 DIAGNOSIS — G20 Parkinson's disease: Secondary | ICD-10-CM | POA: Diagnosis not present

## 2018-03-26 DIAGNOSIS — R296 Repeated falls: Secondary | ICD-10-CM | POA: Diagnosis not present

## 2018-03-26 DIAGNOSIS — R451 Restlessness and agitation: Secondary | ICD-10-CM | POA: Diagnosis not present

## 2018-03-26 DIAGNOSIS — R260 Ataxic gait: Secondary | ICD-10-CM | POA: Diagnosis not present

## 2018-03-26 DIAGNOSIS — F419 Anxiety disorder, unspecified: Secondary | ICD-10-CM | POA: Diagnosis not present

## 2018-03-26 DIAGNOSIS — R531 Weakness: Secondary | ICD-10-CM | POA: Diagnosis not present

## 2018-03-31 DIAGNOSIS — R531 Weakness: Secondary | ICD-10-CM | POA: Diagnosis not present

## 2018-03-31 DIAGNOSIS — F419 Anxiety disorder, unspecified: Secondary | ICD-10-CM | POA: Diagnosis not present

## 2018-03-31 DIAGNOSIS — R260 Ataxic gait: Secondary | ICD-10-CM | POA: Diagnosis not present

## 2018-03-31 DIAGNOSIS — R296 Repeated falls: Secondary | ICD-10-CM | POA: Diagnosis not present

## 2018-03-31 DIAGNOSIS — R451 Restlessness and agitation: Secondary | ICD-10-CM | POA: Diagnosis not present

## 2018-03-31 DIAGNOSIS — G20 Parkinson's disease: Secondary | ICD-10-CM | POA: Diagnosis not present

## 2018-04-01 DIAGNOSIS — G20 Parkinson's disease: Secondary | ICD-10-CM | POA: Diagnosis not present

## 2018-04-01 DIAGNOSIS — Z741 Need for assistance with personal care: Secondary | ICD-10-CM | POA: Diagnosis not present

## 2018-04-01 DIAGNOSIS — R296 Repeated falls: Secondary | ICD-10-CM | POA: Diagnosis not present

## 2018-04-01 DIAGNOSIS — Z743 Need for continuous supervision: Secondary | ICD-10-CM | POA: Diagnosis not present

## 2018-04-01 DIAGNOSIS — Z66 Do not resuscitate: Secondary | ICD-10-CM | POA: Diagnosis not present

## 2018-04-01 DIAGNOSIS — R451 Restlessness and agitation: Secondary | ICD-10-CM | POA: Diagnosis not present

## 2018-04-01 DIAGNOSIS — F419 Anxiety disorder, unspecified: Secondary | ICD-10-CM | POA: Diagnosis not present

## 2018-04-01 DIAGNOSIS — R531 Weakness: Secondary | ICD-10-CM | POA: Diagnosis not present

## 2018-04-01 DIAGNOSIS — R260 Ataxic gait: Secondary | ICD-10-CM | POA: Diagnosis not present

## 2018-04-01 DIAGNOSIS — R634 Abnormal weight loss: Secondary | ICD-10-CM | POA: Diagnosis not present

## 2018-04-01 DIAGNOSIS — R471 Dysarthria and anarthria: Secondary | ICD-10-CM | POA: Diagnosis not present

## 2018-04-02 DIAGNOSIS — G20 Parkinson's disease: Secondary | ICD-10-CM | POA: Diagnosis not present

## 2018-04-02 DIAGNOSIS — R531 Weakness: Secondary | ICD-10-CM | POA: Diagnosis not present

## 2018-04-02 DIAGNOSIS — R260 Ataxic gait: Secondary | ICD-10-CM | POA: Diagnosis not present

## 2018-04-02 DIAGNOSIS — R296 Repeated falls: Secondary | ICD-10-CM | POA: Diagnosis not present

## 2018-04-02 DIAGNOSIS — F419 Anxiety disorder, unspecified: Secondary | ICD-10-CM | POA: Diagnosis not present

## 2018-04-02 DIAGNOSIS — R451 Restlessness and agitation: Secondary | ICD-10-CM | POA: Diagnosis not present

## 2018-04-03 DIAGNOSIS — R451 Restlessness and agitation: Secondary | ICD-10-CM | POA: Diagnosis not present

## 2018-04-03 DIAGNOSIS — R260 Ataxic gait: Secondary | ICD-10-CM | POA: Diagnosis not present

## 2018-04-03 DIAGNOSIS — G20 Parkinson's disease: Secondary | ICD-10-CM | POA: Diagnosis not present

## 2018-04-03 DIAGNOSIS — R531 Weakness: Secondary | ICD-10-CM | POA: Diagnosis not present

## 2018-04-03 DIAGNOSIS — R296 Repeated falls: Secondary | ICD-10-CM | POA: Diagnosis not present

## 2018-04-03 DIAGNOSIS — F419 Anxiety disorder, unspecified: Secondary | ICD-10-CM | POA: Diagnosis not present

## 2018-04-05 DIAGNOSIS — R531 Weakness: Secondary | ICD-10-CM | POA: Diagnosis not present

## 2018-04-05 DIAGNOSIS — F419 Anxiety disorder, unspecified: Secondary | ICD-10-CM | POA: Diagnosis not present

## 2018-04-05 DIAGNOSIS — R451 Restlessness and agitation: Secondary | ICD-10-CM | POA: Diagnosis not present

## 2018-04-05 DIAGNOSIS — R260 Ataxic gait: Secondary | ICD-10-CM | POA: Diagnosis not present

## 2018-04-05 DIAGNOSIS — R296 Repeated falls: Secondary | ICD-10-CM | POA: Diagnosis not present

## 2018-04-05 DIAGNOSIS — G20 Parkinson's disease: Secondary | ICD-10-CM | POA: Diagnosis not present

## 2018-04-06 DIAGNOSIS — G20 Parkinson's disease: Secondary | ICD-10-CM | POA: Diagnosis not present

## 2018-04-06 DIAGNOSIS — R531 Weakness: Secondary | ICD-10-CM | POA: Diagnosis not present

## 2018-04-06 DIAGNOSIS — F419 Anxiety disorder, unspecified: Secondary | ICD-10-CM | POA: Diagnosis not present

## 2018-04-06 DIAGNOSIS — R451 Restlessness and agitation: Secondary | ICD-10-CM | POA: Diagnosis not present

## 2018-04-06 DIAGNOSIS — R296 Repeated falls: Secondary | ICD-10-CM | POA: Diagnosis not present

## 2018-04-06 DIAGNOSIS — R260 Ataxic gait: Secondary | ICD-10-CM | POA: Diagnosis not present

## 2018-04-08 DIAGNOSIS — R451 Restlessness and agitation: Secondary | ICD-10-CM | POA: Diagnosis not present

## 2018-04-08 DIAGNOSIS — F419 Anxiety disorder, unspecified: Secondary | ICD-10-CM | POA: Diagnosis not present

## 2018-04-08 DIAGNOSIS — G20 Parkinson's disease: Secondary | ICD-10-CM | POA: Diagnosis not present

## 2018-04-08 DIAGNOSIS — R531 Weakness: Secondary | ICD-10-CM | POA: Diagnosis not present

## 2018-04-08 DIAGNOSIS — R260 Ataxic gait: Secondary | ICD-10-CM | POA: Diagnosis not present

## 2018-04-08 DIAGNOSIS — R296 Repeated falls: Secondary | ICD-10-CM | POA: Diagnosis not present

## 2018-04-09 DIAGNOSIS — G20 Parkinson's disease: Secondary | ICD-10-CM | POA: Diagnosis not present

## 2018-04-09 DIAGNOSIS — F419 Anxiety disorder, unspecified: Secondary | ICD-10-CM | POA: Diagnosis not present

## 2018-04-09 DIAGNOSIS — R531 Weakness: Secondary | ICD-10-CM | POA: Diagnosis not present

## 2018-04-09 DIAGNOSIS — R451 Restlessness and agitation: Secondary | ICD-10-CM | POA: Diagnosis not present

## 2018-04-09 DIAGNOSIS — R260 Ataxic gait: Secondary | ICD-10-CM | POA: Diagnosis not present

## 2018-04-09 DIAGNOSIS — R296 Repeated falls: Secondary | ICD-10-CM | POA: Diagnosis not present

## 2018-04-10 DIAGNOSIS — R531 Weakness: Secondary | ICD-10-CM | POA: Diagnosis not present

## 2018-04-10 DIAGNOSIS — F419 Anxiety disorder, unspecified: Secondary | ICD-10-CM | POA: Diagnosis not present

## 2018-04-10 DIAGNOSIS — G20 Parkinson's disease: Secondary | ICD-10-CM | POA: Diagnosis not present

## 2018-04-10 DIAGNOSIS — R296 Repeated falls: Secondary | ICD-10-CM | POA: Diagnosis not present

## 2018-04-10 DIAGNOSIS — R260 Ataxic gait: Secondary | ICD-10-CM | POA: Diagnosis not present

## 2018-04-10 DIAGNOSIS — R451 Restlessness and agitation: Secondary | ICD-10-CM | POA: Diagnosis not present

## 2018-04-11 DIAGNOSIS — R260 Ataxic gait: Secondary | ICD-10-CM | POA: Diagnosis not present

## 2018-04-11 DIAGNOSIS — F419 Anxiety disorder, unspecified: Secondary | ICD-10-CM | POA: Diagnosis not present

## 2018-04-11 DIAGNOSIS — R296 Repeated falls: Secondary | ICD-10-CM | POA: Diagnosis not present

## 2018-04-11 DIAGNOSIS — G20 Parkinson's disease: Secondary | ICD-10-CM | POA: Diagnosis not present

## 2018-04-11 DIAGNOSIS — R451 Restlessness and agitation: Secondary | ICD-10-CM | POA: Diagnosis not present

## 2018-04-11 DIAGNOSIS — R531 Weakness: Secondary | ICD-10-CM | POA: Diagnosis not present

## 2018-04-12 DIAGNOSIS — R531 Weakness: Secondary | ICD-10-CM | POA: Diagnosis not present

## 2018-04-12 DIAGNOSIS — R260 Ataxic gait: Secondary | ICD-10-CM | POA: Diagnosis not present

## 2018-04-12 DIAGNOSIS — R296 Repeated falls: Secondary | ICD-10-CM | POA: Diagnosis not present

## 2018-04-12 DIAGNOSIS — G20 Parkinson's disease: Secondary | ICD-10-CM | POA: Diagnosis not present

## 2018-04-12 DIAGNOSIS — R451 Restlessness and agitation: Secondary | ICD-10-CM | POA: Diagnosis not present

## 2018-04-12 DIAGNOSIS — F419 Anxiety disorder, unspecified: Secondary | ICD-10-CM | POA: Diagnosis not present

## 2018-04-15 DIAGNOSIS — G20 Parkinson's disease: Secondary | ICD-10-CM | POA: Diagnosis not present

## 2018-04-15 DIAGNOSIS — R531 Weakness: Secondary | ICD-10-CM | POA: Diagnosis not present

## 2018-04-15 DIAGNOSIS — R296 Repeated falls: Secondary | ICD-10-CM | POA: Diagnosis not present

## 2018-04-15 DIAGNOSIS — F419 Anxiety disorder, unspecified: Secondary | ICD-10-CM | POA: Diagnosis not present

## 2018-04-15 DIAGNOSIS — R451 Restlessness and agitation: Secondary | ICD-10-CM | POA: Diagnosis not present

## 2018-04-15 DIAGNOSIS — R260 Ataxic gait: Secondary | ICD-10-CM | POA: Diagnosis not present

## 2018-04-16 DIAGNOSIS — G20 Parkinson's disease: Secondary | ICD-10-CM | POA: Diagnosis not present

## 2018-04-16 DIAGNOSIS — R296 Repeated falls: Secondary | ICD-10-CM | POA: Diagnosis not present

## 2018-04-16 DIAGNOSIS — F419 Anxiety disorder, unspecified: Secondary | ICD-10-CM | POA: Diagnosis not present

## 2018-04-16 DIAGNOSIS — R531 Weakness: Secondary | ICD-10-CM | POA: Diagnosis not present

## 2018-04-16 DIAGNOSIS — R260 Ataxic gait: Secondary | ICD-10-CM | POA: Diagnosis not present

## 2018-04-16 DIAGNOSIS — R451 Restlessness and agitation: Secondary | ICD-10-CM | POA: Diagnosis not present

## 2018-04-17 DIAGNOSIS — R531 Weakness: Secondary | ICD-10-CM | POA: Diagnosis not present

## 2018-04-17 DIAGNOSIS — F419 Anxiety disorder, unspecified: Secondary | ICD-10-CM | POA: Diagnosis not present

## 2018-04-17 DIAGNOSIS — R296 Repeated falls: Secondary | ICD-10-CM | POA: Diagnosis not present

## 2018-04-17 DIAGNOSIS — R260 Ataxic gait: Secondary | ICD-10-CM | POA: Diagnosis not present

## 2018-04-17 DIAGNOSIS — G20 Parkinson's disease: Secondary | ICD-10-CM | POA: Diagnosis not present

## 2018-04-17 DIAGNOSIS — R451 Restlessness and agitation: Secondary | ICD-10-CM | POA: Diagnosis not present

## 2018-04-19 DIAGNOSIS — R260 Ataxic gait: Secondary | ICD-10-CM | POA: Diagnosis not present

## 2018-04-19 DIAGNOSIS — R531 Weakness: Secondary | ICD-10-CM | POA: Diagnosis not present

## 2018-04-19 DIAGNOSIS — R451 Restlessness and agitation: Secondary | ICD-10-CM | POA: Diagnosis not present

## 2018-04-19 DIAGNOSIS — R296 Repeated falls: Secondary | ICD-10-CM | POA: Diagnosis not present

## 2018-04-19 DIAGNOSIS — F419 Anxiety disorder, unspecified: Secondary | ICD-10-CM | POA: Diagnosis not present

## 2018-04-19 DIAGNOSIS — G20 Parkinson's disease: Secondary | ICD-10-CM | POA: Diagnosis not present

## 2018-04-22 DIAGNOSIS — R531 Weakness: Secondary | ICD-10-CM | POA: Diagnosis not present

## 2018-04-22 DIAGNOSIS — R451 Restlessness and agitation: Secondary | ICD-10-CM | POA: Diagnosis not present

## 2018-04-22 DIAGNOSIS — R296 Repeated falls: Secondary | ICD-10-CM | POA: Diagnosis not present

## 2018-04-22 DIAGNOSIS — G20 Parkinson's disease: Secondary | ICD-10-CM | POA: Diagnosis not present

## 2018-04-22 DIAGNOSIS — F419 Anxiety disorder, unspecified: Secondary | ICD-10-CM | POA: Diagnosis not present

## 2018-04-22 DIAGNOSIS — R260 Ataxic gait: Secondary | ICD-10-CM | POA: Diagnosis not present

## 2018-04-24 DIAGNOSIS — R296 Repeated falls: Secondary | ICD-10-CM | POA: Diagnosis not present

## 2018-04-24 DIAGNOSIS — G20 Parkinson's disease: Secondary | ICD-10-CM | POA: Diagnosis not present

## 2018-04-24 DIAGNOSIS — R451 Restlessness and agitation: Secondary | ICD-10-CM | POA: Diagnosis not present

## 2018-04-24 DIAGNOSIS — R531 Weakness: Secondary | ICD-10-CM | POA: Diagnosis not present

## 2018-04-24 DIAGNOSIS — F419 Anxiety disorder, unspecified: Secondary | ICD-10-CM | POA: Diagnosis not present

## 2018-04-24 DIAGNOSIS — R260 Ataxic gait: Secondary | ICD-10-CM | POA: Diagnosis not present

## 2018-04-26 DIAGNOSIS — R531 Weakness: Secondary | ICD-10-CM | POA: Diagnosis not present

## 2018-04-26 DIAGNOSIS — R296 Repeated falls: Secondary | ICD-10-CM | POA: Diagnosis not present

## 2018-04-26 DIAGNOSIS — R451 Restlessness and agitation: Secondary | ICD-10-CM | POA: Diagnosis not present

## 2018-04-26 DIAGNOSIS — G20 Parkinson's disease: Secondary | ICD-10-CM | POA: Diagnosis not present

## 2018-04-26 DIAGNOSIS — R260 Ataxic gait: Secondary | ICD-10-CM | POA: Diagnosis not present

## 2018-04-26 DIAGNOSIS — F419 Anxiety disorder, unspecified: Secondary | ICD-10-CM | POA: Diagnosis not present

## 2018-04-29 DIAGNOSIS — F419 Anxiety disorder, unspecified: Secondary | ICD-10-CM | POA: Diagnosis not present

## 2018-04-29 DIAGNOSIS — R531 Weakness: Secondary | ICD-10-CM | POA: Diagnosis not present

## 2018-04-29 DIAGNOSIS — R451 Restlessness and agitation: Secondary | ICD-10-CM | POA: Diagnosis not present

## 2018-04-29 DIAGNOSIS — R260 Ataxic gait: Secondary | ICD-10-CM | POA: Diagnosis not present

## 2018-04-29 DIAGNOSIS — G20 Parkinson's disease: Secondary | ICD-10-CM | POA: Diagnosis not present

## 2018-04-29 DIAGNOSIS — R296 Repeated falls: Secondary | ICD-10-CM | POA: Diagnosis not present

## 2018-04-30 DIAGNOSIS — F419 Anxiety disorder, unspecified: Secondary | ICD-10-CM | POA: Diagnosis not present

## 2018-04-30 DIAGNOSIS — R451 Restlessness and agitation: Secondary | ICD-10-CM | POA: Diagnosis not present

## 2018-04-30 DIAGNOSIS — R531 Weakness: Secondary | ICD-10-CM | POA: Diagnosis not present

## 2018-04-30 DIAGNOSIS — R260 Ataxic gait: Secondary | ICD-10-CM | POA: Diagnosis not present

## 2018-04-30 DIAGNOSIS — G20 Parkinson's disease: Secondary | ICD-10-CM | POA: Diagnosis not present

## 2018-04-30 DIAGNOSIS — R296 Repeated falls: Secondary | ICD-10-CM | POA: Diagnosis not present

## 2018-05-01 DIAGNOSIS — R451 Restlessness and agitation: Secondary | ICD-10-CM | POA: Diagnosis not present

## 2018-05-01 DIAGNOSIS — R260 Ataxic gait: Secondary | ICD-10-CM | POA: Diagnosis not present

## 2018-05-01 DIAGNOSIS — R296 Repeated falls: Secondary | ICD-10-CM | POA: Diagnosis not present

## 2018-05-01 DIAGNOSIS — R531 Weakness: Secondary | ICD-10-CM | POA: Diagnosis not present

## 2018-05-01 DIAGNOSIS — G20 Parkinson's disease: Secondary | ICD-10-CM | POA: Diagnosis not present

## 2018-05-01 DIAGNOSIS — F419 Anxiety disorder, unspecified: Secondary | ICD-10-CM | POA: Diagnosis not present

## 2018-05-02 DIAGNOSIS — Z741 Need for assistance with personal care: Secondary | ICD-10-CM | POA: Diagnosis not present

## 2018-05-02 DIAGNOSIS — Z743 Need for continuous supervision: Secondary | ICD-10-CM | POA: Diagnosis not present

## 2018-05-02 DIAGNOSIS — F419 Anxiety disorder, unspecified: Secondary | ICD-10-CM | POA: Diagnosis not present

## 2018-05-02 DIAGNOSIS — R634 Abnormal weight loss: Secondary | ICD-10-CM | POA: Diagnosis not present

## 2018-05-02 DIAGNOSIS — G20 Parkinson's disease: Secondary | ICD-10-CM | POA: Diagnosis not present

## 2018-05-02 DIAGNOSIS — R471 Dysarthria and anarthria: Secondary | ICD-10-CM | POA: Diagnosis not present

## 2018-05-02 DIAGNOSIS — Z66 Do not resuscitate: Secondary | ICD-10-CM | POA: Diagnosis not present

## 2018-05-02 DIAGNOSIS — R451 Restlessness and agitation: Secondary | ICD-10-CM | POA: Diagnosis not present

## 2018-05-02 DIAGNOSIS — R260 Ataxic gait: Secondary | ICD-10-CM | POA: Diagnosis not present

## 2018-05-02 DIAGNOSIS — R296 Repeated falls: Secondary | ICD-10-CM | POA: Diagnosis not present

## 2018-05-02 DIAGNOSIS — R531 Weakness: Secondary | ICD-10-CM | POA: Diagnosis not present

## 2018-05-03 DIAGNOSIS — R296 Repeated falls: Secondary | ICD-10-CM | POA: Diagnosis not present

## 2018-05-03 DIAGNOSIS — R451 Restlessness and agitation: Secondary | ICD-10-CM | POA: Diagnosis not present

## 2018-05-03 DIAGNOSIS — F419 Anxiety disorder, unspecified: Secondary | ICD-10-CM | POA: Diagnosis not present

## 2018-05-03 DIAGNOSIS — R260 Ataxic gait: Secondary | ICD-10-CM | POA: Diagnosis not present

## 2018-05-03 DIAGNOSIS — R531 Weakness: Secondary | ICD-10-CM | POA: Diagnosis not present

## 2018-05-03 DIAGNOSIS — G20 Parkinson's disease: Secondary | ICD-10-CM | POA: Diagnosis not present

## 2018-05-06 DIAGNOSIS — R296 Repeated falls: Secondary | ICD-10-CM | POA: Diagnosis not present

## 2018-05-06 DIAGNOSIS — R451 Restlessness and agitation: Secondary | ICD-10-CM | POA: Diagnosis not present

## 2018-05-06 DIAGNOSIS — R531 Weakness: Secondary | ICD-10-CM | POA: Diagnosis not present

## 2018-05-06 DIAGNOSIS — G20 Parkinson's disease: Secondary | ICD-10-CM | POA: Diagnosis not present

## 2018-05-06 DIAGNOSIS — R260 Ataxic gait: Secondary | ICD-10-CM | POA: Diagnosis not present

## 2018-05-06 DIAGNOSIS — F419 Anxiety disorder, unspecified: Secondary | ICD-10-CM | POA: Diagnosis not present

## 2018-05-07 DIAGNOSIS — R531 Weakness: Secondary | ICD-10-CM | POA: Diagnosis not present

## 2018-05-07 DIAGNOSIS — F419 Anxiety disorder, unspecified: Secondary | ICD-10-CM | POA: Diagnosis not present

## 2018-05-07 DIAGNOSIS — R451 Restlessness and agitation: Secondary | ICD-10-CM | POA: Diagnosis not present

## 2018-05-07 DIAGNOSIS — G20 Parkinson's disease: Secondary | ICD-10-CM | POA: Diagnosis not present

## 2018-05-07 DIAGNOSIS — R260 Ataxic gait: Secondary | ICD-10-CM | POA: Diagnosis not present

## 2018-05-07 DIAGNOSIS — R296 Repeated falls: Secondary | ICD-10-CM | POA: Diagnosis not present

## 2018-05-08 DIAGNOSIS — R260 Ataxic gait: Secondary | ICD-10-CM | POA: Diagnosis not present

## 2018-05-08 DIAGNOSIS — G20 Parkinson's disease: Secondary | ICD-10-CM | POA: Diagnosis not present

## 2018-05-08 DIAGNOSIS — F419 Anxiety disorder, unspecified: Secondary | ICD-10-CM | POA: Diagnosis not present

## 2018-05-08 DIAGNOSIS — R531 Weakness: Secondary | ICD-10-CM | POA: Diagnosis not present

## 2018-05-08 DIAGNOSIS — R296 Repeated falls: Secondary | ICD-10-CM | POA: Diagnosis not present

## 2018-05-08 DIAGNOSIS — R451 Restlessness and agitation: Secondary | ICD-10-CM | POA: Diagnosis not present

## 2018-05-10 DIAGNOSIS — R296 Repeated falls: Secondary | ICD-10-CM | POA: Diagnosis not present

## 2018-05-10 DIAGNOSIS — R451 Restlessness and agitation: Secondary | ICD-10-CM | POA: Diagnosis not present

## 2018-05-10 DIAGNOSIS — R260 Ataxic gait: Secondary | ICD-10-CM | POA: Diagnosis not present

## 2018-05-10 DIAGNOSIS — F419 Anxiety disorder, unspecified: Secondary | ICD-10-CM | POA: Diagnosis not present

## 2018-05-10 DIAGNOSIS — R531 Weakness: Secondary | ICD-10-CM | POA: Diagnosis not present

## 2018-05-10 DIAGNOSIS — G20 Parkinson's disease: Secondary | ICD-10-CM | POA: Diagnosis not present

## 2018-05-13 DIAGNOSIS — G20 Parkinson's disease: Secondary | ICD-10-CM | POA: Diagnosis not present

## 2018-05-13 DIAGNOSIS — R451 Restlessness and agitation: Secondary | ICD-10-CM | POA: Diagnosis not present

## 2018-05-13 DIAGNOSIS — R296 Repeated falls: Secondary | ICD-10-CM | POA: Diagnosis not present

## 2018-05-13 DIAGNOSIS — R531 Weakness: Secondary | ICD-10-CM | POA: Diagnosis not present

## 2018-05-13 DIAGNOSIS — R260 Ataxic gait: Secondary | ICD-10-CM | POA: Diagnosis not present

## 2018-05-13 DIAGNOSIS — F419 Anxiety disorder, unspecified: Secondary | ICD-10-CM | POA: Diagnosis not present

## 2018-05-14 DIAGNOSIS — F419 Anxiety disorder, unspecified: Secondary | ICD-10-CM | POA: Diagnosis not present

## 2018-05-14 DIAGNOSIS — R451 Restlessness and agitation: Secondary | ICD-10-CM | POA: Diagnosis not present

## 2018-05-14 DIAGNOSIS — R260 Ataxic gait: Secondary | ICD-10-CM | POA: Diagnosis not present

## 2018-05-14 DIAGNOSIS — R531 Weakness: Secondary | ICD-10-CM | POA: Diagnosis not present

## 2018-05-14 DIAGNOSIS — G20 Parkinson's disease: Secondary | ICD-10-CM | POA: Diagnosis not present

## 2018-05-14 DIAGNOSIS — R296 Repeated falls: Secondary | ICD-10-CM | POA: Diagnosis not present

## 2018-05-15 DIAGNOSIS — G20 Parkinson's disease: Secondary | ICD-10-CM | POA: Diagnosis not present

## 2018-05-15 DIAGNOSIS — R296 Repeated falls: Secondary | ICD-10-CM | POA: Diagnosis not present

## 2018-05-15 DIAGNOSIS — R531 Weakness: Secondary | ICD-10-CM | POA: Diagnosis not present

## 2018-05-15 DIAGNOSIS — R451 Restlessness and agitation: Secondary | ICD-10-CM | POA: Diagnosis not present

## 2018-05-15 DIAGNOSIS — R260 Ataxic gait: Secondary | ICD-10-CM | POA: Diagnosis not present

## 2018-05-15 DIAGNOSIS — F419 Anxiety disorder, unspecified: Secondary | ICD-10-CM | POA: Diagnosis not present

## 2018-05-16 DIAGNOSIS — F419 Anxiety disorder, unspecified: Secondary | ICD-10-CM | POA: Diagnosis not present

## 2018-05-16 DIAGNOSIS — R260 Ataxic gait: Secondary | ICD-10-CM | POA: Diagnosis not present

## 2018-05-16 DIAGNOSIS — G20 Parkinson's disease: Secondary | ICD-10-CM | POA: Diagnosis not present

## 2018-05-16 DIAGNOSIS — R296 Repeated falls: Secondary | ICD-10-CM | POA: Diagnosis not present

## 2018-05-16 DIAGNOSIS — R451 Restlessness and agitation: Secondary | ICD-10-CM | POA: Diagnosis not present

## 2018-05-16 DIAGNOSIS — R531 Weakness: Secondary | ICD-10-CM | POA: Diagnosis not present

## 2018-05-17 DIAGNOSIS — G20 Parkinson's disease: Secondary | ICD-10-CM | POA: Diagnosis not present

## 2018-05-17 DIAGNOSIS — R260 Ataxic gait: Secondary | ICD-10-CM | POA: Diagnosis not present

## 2018-05-17 DIAGNOSIS — R296 Repeated falls: Secondary | ICD-10-CM | POA: Diagnosis not present

## 2018-05-17 DIAGNOSIS — R451 Restlessness and agitation: Secondary | ICD-10-CM | POA: Diagnosis not present

## 2018-05-17 DIAGNOSIS — R531 Weakness: Secondary | ICD-10-CM | POA: Diagnosis not present

## 2018-05-17 DIAGNOSIS — F419 Anxiety disorder, unspecified: Secondary | ICD-10-CM | POA: Diagnosis not present

## 2018-05-20 DIAGNOSIS — R260 Ataxic gait: Secondary | ICD-10-CM | POA: Diagnosis not present

## 2018-05-20 DIAGNOSIS — R531 Weakness: Secondary | ICD-10-CM | POA: Diagnosis not present

## 2018-05-20 DIAGNOSIS — R296 Repeated falls: Secondary | ICD-10-CM | POA: Diagnosis not present

## 2018-05-20 DIAGNOSIS — R451 Restlessness and agitation: Secondary | ICD-10-CM | POA: Diagnosis not present

## 2018-05-20 DIAGNOSIS — G20 Parkinson's disease: Secondary | ICD-10-CM | POA: Diagnosis not present

## 2018-05-20 DIAGNOSIS — F419 Anxiety disorder, unspecified: Secondary | ICD-10-CM | POA: Diagnosis not present

## 2018-05-21 DIAGNOSIS — R260 Ataxic gait: Secondary | ICD-10-CM | POA: Diagnosis not present

## 2018-05-21 DIAGNOSIS — R531 Weakness: Secondary | ICD-10-CM | POA: Diagnosis not present

## 2018-05-21 DIAGNOSIS — F419 Anxiety disorder, unspecified: Secondary | ICD-10-CM | POA: Diagnosis not present

## 2018-05-21 DIAGNOSIS — R296 Repeated falls: Secondary | ICD-10-CM | POA: Diagnosis not present

## 2018-05-21 DIAGNOSIS — G20 Parkinson's disease: Secondary | ICD-10-CM | POA: Diagnosis not present

## 2018-05-21 DIAGNOSIS — R451 Restlessness and agitation: Secondary | ICD-10-CM | POA: Diagnosis not present

## 2018-05-22 DIAGNOSIS — R451 Restlessness and agitation: Secondary | ICD-10-CM | POA: Diagnosis not present

## 2018-05-22 DIAGNOSIS — R260 Ataxic gait: Secondary | ICD-10-CM | POA: Diagnosis not present

## 2018-05-22 DIAGNOSIS — R531 Weakness: Secondary | ICD-10-CM | POA: Diagnosis not present

## 2018-05-22 DIAGNOSIS — F419 Anxiety disorder, unspecified: Secondary | ICD-10-CM | POA: Diagnosis not present

## 2018-05-22 DIAGNOSIS — G20 Parkinson's disease: Secondary | ICD-10-CM | POA: Diagnosis not present

## 2018-05-22 DIAGNOSIS — R296 Repeated falls: Secondary | ICD-10-CM | POA: Diagnosis not present

## 2018-05-23 DIAGNOSIS — R296 Repeated falls: Secondary | ICD-10-CM | POA: Diagnosis not present

## 2018-05-23 DIAGNOSIS — F419 Anxiety disorder, unspecified: Secondary | ICD-10-CM | POA: Diagnosis not present

## 2018-05-23 DIAGNOSIS — G20 Parkinson's disease: Secondary | ICD-10-CM | POA: Diagnosis not present

## 2018-05-23 DIAGNOSIS — R531 Weakness: Secondary | ICD-10-CM | POA: Diagnosis not present

## 2018-05-23 DIAGNOSIS — R451 Restlessness and agitation: Secondary | ICD-10-CM | POA: Diagnosis not present

## 2018-05-23 DIAGNOSIS — R260 Ataxic gait: Secondary | ICD-10-CM | POA: Diagnosis not present

## 2018-05-24 DIAGNOSIS — G20 Parkinson's disease: Secondary | ICD-10-CM | POA: Diagnosis not present

## 2018-05-24 DIAGNOSIS — R260 Ataxic gait: Secondary | ICD-10-CM | POA: Diagnosis not present

## 2018-05-24 DIAGNOSIS — R296 Repeated falls: Secondary | ICD-10-CM | POA: Diagnosis not present

## 2018-05-24 DIAGNOSIS — R531 Weakness: Secondary | ICD-10-CM | POA: Diagnosis not present

## 2018-05-24 DIAGNOSIS — R451 Restlessness and agitation: Secondary | ICD-10-CM | POA: Diagnosis not present

## 2018-05-24 DIAGNOSIS — F419 Anxiety disorder, unspecified: Secondary | ICD-10-CM | POA: Diagnosis not present

## 2018-05-27 DIAGNOSIS — R531 Weakness: Secondary | ICD-10-CM | POA: Diagnosis not present

## 2018-05-27 DIAGNOSIS — G20 Parkinson's disease: Secondary | ICD-10-CM | POA: Diagnosis not present

## 2018-05-27 DIAGNOSIS — F419 Anxiety disorder, unspecified: Secondary | ICD-10-CM | POA: Diagnosis not present

## 2018-05-27 DIAGNOSIS — R296 Repeated falls: Secondary | ICD-10-CM | POA: Diagnosis not present

## 2018-05-27 DIAGNOSIS — R260 Ataxic gait: Secondary | ICD-10-CM | POA: Diagnosis not present

## 2018-05-27 DIAGNOSIS — R451 Restlessness and agitation: Secondary | ICD-10-CM | POA: Diagnosis not present

## 2018-05-28 DIAGNOSIS — R296 Repeated falls: Secondary | ICD-10-CM | POA: Diagnosis not present

## 2018-05-28 DIAGNOSIS — R451 Restlessness and agitation: Secondary | ICD-10-CM | POA: Diagnosis not present

## 2018-05-28 DIAGNOSIS — G20 Parkinson's disease: Secondary | ICD-10-CM | POA: Diagnosis not present

## 2018-05-28 DIAGNOSIS — F419 Anxiety disorder, unspecified: Secondary | ICD-10-CM | POA: Diagnosis not present

## 2018-05-28 DIAGNOSIS — R531 Weakness: Secondary | ICD-10-CM | POA: Diagnosis not present

## 2018-05-28 DIAGNOSIS — R260 Ataxic gait: Secondary | ICD-10-CM | POA: Diagnosis not present

## 2018-05-29 DIAGNOSIS — R451 Restlessness and agitation: Secondary | ICD-10-CM | POA: Diagnosis not present

## 2018-05-29 DIAGNOSIS — R260 Ataxic gait: Secondary | ICD-10-CM | POA: Diagnosis not present

## 2018-05-29 DIAGNOSIS — F419 Anxiety disorder, unspecified: Secondary | ICD-10-CM | POA: Diagnosis not present

## 2018-05-29 DIAGNOSIS — R296 Repeated falls: Secondary | ICD-10-CM | POA: Diagnosis not present

## 2018-05-29 DIAGNOSIS — G20 Parkinson's disease: Secondary | ICD-10-CM | POA: Diagnosis not present

## 2018-05-29 DIAGNOSIS — R531 Weakness: Secondary | ICD-10-CM | POA: Diagnosis not present

## 2018-05-31 DIAGNOSIS — R451 Restlessness and agitation: Secondary | ICD-10-CM | POA: Diagnosis not present

## 2018-05-31 DIAGNOSIS — R260 Ataxic gait: Secondary | ICD-10-CM | POA: Diagnosis not present

## 2018-05-31 DIAGNOSIS — F419 Anxiety disorder, unspecified: Secondary | ICD-10-CM | POA: Diagnosis not present

## 2018-05-31 DIAGNOSIS — R531 Weakness: Secondary | ICD-10-CM | POA: Diagnosis not present

## 2018-05-31 DIAGNOSIS — R296 Repeated falls: Secondary | ICD-10-CM | POA: Diagnosis not present

## 2018-05-31 DIAGNOSIS — G20 Parkinson's disease: Secondary | ICD-10-CM | POA: Diagnosis not present

## 2018-06-02 DIAGNOSIS — R260 Ataxic gait: Secondary | ICD-10-CM | POA: Diagnosis not present

## 2018-06-02 DIAGNOSIS — R451 Restlessness and agitation: Secondary | ICD-10-CM | POA: Diagnosis not present

## 2018-06-02 DIAGNOSIS — Z66 Do not resuscitate: Secondary | ICD-10-CM | POA: Diagnosis not present

## 2018-06-02 DIAGNOSIS — Z741 Need for assistance with personal care: Secondary | ICD-10-CM | POA: Diagnosis not present

## 2018-06-02 DIAGNOSIS — R296 Repeated falls: Secondary | ICD-10-CM | POA: Diagnosis not present

## 2018-06-02 DIAGNOSIS — R531 Weakness: Secondary | ICD-10-CM | POA: Diagnosis not present

## 2018-06-02 DIAGNOSIS — R471 Dysarthria and anarthria: Secondary | ICD-10-CM | POA: Diagnosis not present

## 2018-06-02 DIAGNOSIS — Z743 Need for continuous supervision: Secondary | ICD-10-CM | POA: Diagnosis not present

## 2018-06-02 DIAGNOSIS — G20 Parkinson's disease: Secondary | ICD-10-CM | POA: Diagnosis not present

## 2018-06-02 DIAGNOSIS — R634 Abnormal weight loss: Secondary | ICD-10-CM | POA: Diagnosis not present

## 2018-06-02 DIAGNOSIS — F419 Anxiety disorder, unspecified: Secondary | ICD-10-CM | POA: Diagnosis not present

## 2018-06-04 DIAGNOSIS — R260 Ataxic gait: Secondary | ICD-10-CM | POA: Diagnosis not present

## 2018-06-04 DIAGNOSIS — G20 Parkinson's disease: Secondary | ICD-10-CM | POA: Diagnosis not present

## 2018-06-04 DIAGNOSIS — R451 Restlessness and agitation: Secondary | ICD-10-CM | POA: Diagnosis not present

## 2018-06-04 DIAGNOSIS — R531 Weakness: Secondary | ICD-10-CM | POA: Diagnosis not present

## 2018-06-04 DIAGNOSIS — R296 Repeated falls: Secondary | ICD-10-CM | POA: Diagnosis not present

## 2018-06-04 DIAGNOSIS — F419 Anxiety disorder, unspecified: Secondary | ICD-10-CM | POA: Diagnosis not present

## 2018-06-05 DIAGNOSIS — R531 Weakness: Secondary | ICD-10-CM | POA: Diagnosis not present

## 2018-06-05 DIAGNOSIS — R296 Repeated falls: Secondary | ICD-10-CM | POA: Diagnosis not present

## 2018-06-05 DIAGNOSIS — F419 Anxiety disorder, unspecified: Secondary | ICD-10-CM | POA: Diagnosis not present

## 2018-06-05 DIAGNOSIS — G20 Parkinson's disease: Secondary | ICD-10-CM | POA: Diagnosis not present

## 2018-06-05 DIAGNOSIS — R451 Restlessness and agitation: Secondary | ICD-10-CM | POA: Diagnosis not present

## 2018-06-05 DIAGNOSIS — R260 Ataxic gait: Secondary | ICD-10-CM | POA: Diagnosis not present

## 2018-06-07 DIAGNOSIS — R531 Weakness: Secondary | ICD-10-CM | POA: Diagnosis not present

## 2018-06-07 DIAGNOSIS — F419 Anxiety disorder, unspecified: Secondary | ICD-10-CM | POA: Diagnosis not present

## 2018-06-07 DIAGNOSIS — G20 Parkinson's disease: Secondary | ICD-10-CM | POA: Diagnosis not present

## 2018-06-07 DIAGNOSIS — R451 Restlessness and agitation: Secondary | ICD-10-CM | POA: Diagnosis not present

## 2018-06-07 DIAGNOSIS — R296 Repeated falls: Secondary | ICD-10-CM | POA: Diagnosis not present

## 2018-06-07 DIAGNOSIS — R260 Ataxic gait: Secondary | ICD-10-CM | POA: Diagnosis not present

## 2018-06-10 DIAGNOSIS — R531 Weakness: Secondary | ICD-10-CM | POA: Diagnosis not present

## 2018-06-10 DIAGNOSIS — R451 Restlessness and agitation: Secondary | ICD-10-CM | POA: Diagnosis not present

## 2018-06-10 DIAGNOSIS — G20 Parkinson's disease: Secondary | ICD-10-CM | POA: Diagnosis not present

## 2018-06-10 DIAGNOSIS — F419 Anxiety disorder, unspecified: Secondary | ICD-10-CM | POA: Diagnosis not present

## 2018-06-10 DIAGNOSIS — R260 Ataxic gait: Secondary | ICD-10-CM | POA: Diagnosis not present

## 2018-06-10 DIAGNOSIS — R296 Repeated falls: Secondary | ICD-10-CM | POA: Diagnosis not present

## 2018-06-11 DIAGNOSIS — R260 Ataxic gait: Secondary | ICD-10-CM | POA: Diagnosis not present

## 2018-06-11 DIAGNOSIS — G20 Parkinson's disease: Secondary | ICD-10-CM | POA: Diagnosis not present

## 2018-06-11 DIAGNOSIS — R451 Restlessness and agitation: Secondary | ICD-10-CM | POA: Diagnosis not present

## 2018-06-11 DIAGNOSIS — R296 Repeated falls: Secondary | ICD-10-CM | POA: Diagnosis not present

## 2018-06-11 DIAGNOSIS — R531 Weakness: Secondary | ICD-10-CM | POA: Diagnosis not present

## 2018-06-11 DIAGNOSIS — F419 Anxiety disorder, unspecified: Secondary | ICD-10-CM | POA: Diagnosis not present

## 2018-06-12 DIAGNOSIS — R531 Weakness: Secondary | ICD-10-CM | POA: Diagnosis not present

## 2018-06-12 DIAGNOSIS — F419 Anxiety disorder, unspecified: Secondary | ICD-10-CM | POA: Diagnosis not present

## 2018-06-12 DIAGNOSIS — R260 Ataxic gait: Secondary | ICD-10-CM | POA: Diagnosis not present

## 2018-06-12 DIAGNOSIS — R296 Repeated falls: Secondary | ICD-10-CM | POA: Diagnosis not present

## 2018-06-12 DIAGNOSIS — R451 Restlessness and agitation: Secondary | ICD-10-CM | POA: Diagnosis not present

## 2018-06-12 DIAGNOSIS — G20 Parkinson's disease: Secondary | ICD-10-CM | POA: Diagnosis not present

## 2018-06-13 DIAGNOSIS — R451 Restlessness and agitation: Secondary | ICD-10-CM | POA: Diagnosis not present

## 2018-06-13 DIAGNOSIS — G20 Parkinson's disease: Secondary | ICD-10-CM | POA: Diagnosis not present

## 2018-06-13 DIAGNOSIS — R296 Repeated falls: Secondary | ICD-10-CM | POA: Diagnosis not present

## 2018-06-13 DIAGNOSIS — R260 Ataxic gait: Secondary | ICD-10-CM | POA: Diagnosis not present

## 2018-06-13 DIAGNOSIS — R531 Weakness: Secondary | ICD-10-CM | POA: Diagnosis not present

## 2018-06-13 DIAGNOSIS — F419 Anxiety disorder, unspecified: Secondary | ICD-10-CM | POA: Diagnosis not present

## 2018-06-14 DIAGNOSIS — R531 Weakness: Secondary | ICD-10-CM | POA: Diagnosis not present

## 2018-06-14 DIAGNOSIS — R296 Repeated falls: Secondary | ICD-10-CM | POA: Diagnosis not present

## 2018-06-14 DIAGNOSIS — R260 Ataxic gait: Secondary | ICD-10-CM | POA: Diagnosis not present

## 2018-06-14 DIAGNOSIS — R451 Restlessness and agitation: Secondary | ICD-10-CM | POA: Diagnosis not present

## 2018-06-14 DIAGNOSIS — G20 Parkinson's disease: Secondary | ICD-10-CM | POA: Diagnosis not present

## 2018-06-14 DIAGNOSIS — F419 Anxiety disorder, unspecified: Secondary | ICD-10-CM | POA: Diagnosis not present

## 2018-06-17 DIAGNOSIS — F419 Anxiety disorder, unspecified: Secondary | ICD-10-CM | POA: Diagnosis not present

## 2018-06-17 DIAGNOSIS — R260 Ataxic gait: Secondary | ICD-10-CM | POA: Diagnosis not present

## 2018-06-17 DIAGNOSIS — R296 Repeated falls: Secondary | ICD-10-CM | POA: Diagnosis not present

## 2018-06-17 DIAGNOSIS — G20 Parkinson's disease: Secondary | ICD-10-CM | POA: Diagnosis not present

## 2018-06-17 DIAGNOSIS — R531 Weakness: Secondary | ICD-10-CM | POA: Diagnosis not present

## 2018-06-17 DIAGNOSIS — R451 Restlessness and agitation: Secondary | ICD-10-CM | POA: Diagnosis not present

## 2018-06-18 DIAGNOSIS — R451 Restlessness and agitation: Secondary | ICD-10-CM | POA: Diagnosis not present

## 2018-06-18 DIAGNOSIS — F419 Anxiety disorder, unspecified: Secondary | ICD-10-CM | POA: Diagnosis not present

## 2018-06-18 DIAGNOSIS — R260 Ataxic gait: Secondary | ICD-10-CM | POA: Diagnosis not present

## 2018-06-18 DIAGNOSIS — R296 Repeated falls: Secondary | ICD-10-CM | POA: Diagnosis not present

## 2018-06-18 DIAGNOSIS — R531 Weakness: Secondary | ICD-10-CM | POA: Diagnosis not present

## 2018-06-18 DIAGNOSIS — G20 Parkinson's disease: Secondary | ICD-10-CM | POA: Diagnosis not present

## 2018-06-19 DIAGNOSIS — R260 Ataxic gait: Secondary | ICD-10-CM | POA: Diagnosis not present

## 2018-06-19 DIAGNOSIS — R296 Repeated falls: Secondary | ICD-10-CM | POA: Diagnosis not present

## 2018-06-19 DIAGNOSIS — G20 Parkinson's disease: Secondary | ICD-10-CM | POA: Diagnosis not present

## 2018-06-19 DIAGNOSIS — F419 Anxiety disorder, unspecified: Secondary | ICD-10-CM | POA: Diagnosis not present

## 2018-06-19 DIAGNOSIS — R451 Restlessness and agitation: Secondary | ICD-10-CM | POA: Diagnosis not present

## 2018-06-19 DIAGNOSIS — R531 Weakness: Secondary | ICD-10-CM | POA: Diagnosis not present

## 2018-06-21 DIAGNOSIS — R531 Weakness: Secondary | ICD-10-CM | POA: Diagnosis not present

## 2018-06-21 DIAGNOSIS — R296 Repeated falls: Secondary | ICD-10-CM | POA: Diagnosis not present

## 2018-06-21 DIAGNOSIS — R260 Ataxic gait: Secondary | ICD-10-CM | POA: Diagnosis not present

## 2018-06-21 DIAGNOSIS — F419 Anxiety disorder, unspecified: Secondary | ICD-10-CM | POA: Diagnosis not present

## 2018-06-21 DIAGNOSIS — R451 Restlessness and agitation: Secondary | ICD-10-CM | POA: Diagnosis not present

## 2018-06-21 DIAGNOSIS — G20 Parkinson's disease: Secondary | ICD-10-CM | POA: Diagnosis not present

## 2018-06-26 DIAGNOSIS — F419 Anxiety disorder, unspecified: Secondary | ICD-10-CM | POA: Diagnosis not present

## 2018-06-26 DIAGNOSIS — R260 Ataxic gait: Secondary | ICD-10-CM | POA: Diagnosis not present

## 2018-06-26 DIAGNOSIS — G20 Parkinson's disease: Secondary | ICD-10-CM | POA: Diagnosis not present

## 2018-06-26 DIAGNOSIS — R531 Weakness: Secondary | ICD-10-CM | POA: Diagnosis not present

## 2018-06-26 DIAGNOSIS — R451 Restlessness and agitation: Secondary | ICD-10-CM | POA: Diagnosis not present

## 2018-06-26 DIAGNOSIS — R296 Repeated falls: Secondary | ICD-10-CM | POA: Diagnosis not present

## 2018-06-28 DIAGNOSIS — G20 Parkinson's disease: Secondary | ICD-10-CM | POA: Diagnosis not present

## 2018-06-28 DIAGNOSIS — R260 Ataxic gait: Secondary | ICD-10-CM | POA: Diagnosis not present

## 2018-06-28 DIAGNOSIS — R296 Repeated falls: Secondary | ICD-10-CM | POA: Diagnosis not present

## 2018-06-28 DIAGNOSIS — F419 Anxiety disorder, unspecified: Secondary | ICD-10-CM | POA: Diagnosis not present

## 2018-06-28 DIAGNOSIS — R531 Weakness: Secondary | ICD-10-CM | POA: Diagnosis not present

## 2018-06-28 DIAGNOSIS — R451 Restlessness and agitation: Secondary | ICD-10-CM | POA: Diagnosis not present

## 2018-07-01 DIAGNOSIS — R451 Restlessness and agitation: Secondary | ICD-10-CM | POA: Diagnosis not present

## 2018-07-01 DIAGNOSIS — R296 Repeated falls: Secondary | ICD-10-CM | POA: Diagnosis not present

## 2018-07-01 DIAGNOSIS — R260 Ataxic gait: Secondary | ICD-10-CM | POA: Diagnosis not present

## 2018-07-01 DIAGNOSIS — R531 Weakness: Secondary | ICD-10-CM | POA: Diagnosis not present

## 2018-07-01 DIAGNOSIS — G20 Parkinson's disease: Secondary | ICD-10-CM | POA: Diagnosis not present

## 2018-07-01 DIAGNOSIS — F419 Anxiety disorder, unspecified: Secondary | ICD-10-CM | POA: Diagnosis not present

## 2018-07-02 DIAGNOSIS — R531 Weakness: Secondary | ICD-10-CM | POA: Diagnosis not present

## 2018-07-02 DIAGNOSIS — R260 Ataxic gait: Secondary | ICD-10-CM | POA: Diagnosis not present

## 2018-07-02 DIAGNOSIS — G20 Parkinson's disease: Secondary | ICD-10-CM | POA: Diagnosis not present

## 2018-07-02 DIAGNOSIS — R451 Restlessness and agitation: Secondary | ICD-10-CM | POA: Diagnosis not present

## 2018-07-02 DIAGNOSIS — Z741 Need for assistance with personal care: Secondary | ICD-10-CM | POA: Diagnosis not present

## 2018-07-02 DIAGNOSIS — R634 Abnormal weight loss: Secondary | ICD-10-CM | POA: Diagnosis not present

## 2018-07-02 DIAGNOSIS — Z66 Do not resuscitate: Secondary | ICD-10-CM | POA: Diagnosis not present

## 2018-07-02 DIAGNOSIS — R296 Repeated falls: Secondary | ICD-10-CM | POA: Diagnosis not present

## 2018-07-02 DIAGNOSIS — R471 Dysarthria and anarthria: Secondary | ICD-10-CM | POA: Diagnosis not present

## 2018-07-02 DIAGNOSIS — F419 Anxiety disorder, unspecified: Secondary | ICD-10-CM | POA: Diagnosis not present

## 2018-07-02 DIAGNOSIS — Z743 Need for continuous supervision: Secondary | ICD-10-CM | POA: Diagnosis not present

## 2018-07-03 DIAGNOSIS — R531 Weakness: Secondary | ICD-10-CM | POA: Diagnosis not present

## 2018-07-03 DIAGNOSIS — G20 Parkinson's disease: Secondary | ICD-10-CM | POA: Diagnosis not present

## 2018-07-03 DIAGNOSIS — R296 Repeated falls: Secondary | ICD-10-CM | POA: Diagnosis not present

## 2018-07-03 DIAGNOSIS — R260 Ataxic gait: Secondary | ICD-10-CM | POA: Diagnosis not present

## 2018-07-03 DIAGNOSIS — F419 Anxiety disorder, unspecified: Secondary | ICD-10-CM | POA: Diagnosis not present

## 2018-07-03 DIAGNOSIS — R451 Restlessness and agitation: Secondary | ICD-10-CM | POA: Diagnosis not present

## 2018-07-05 DIAGNOSIS — G20 Parkinson's disease: Secondary | ICD-10-CM | POA: Diagnosis not present

## 2018-07-05 DIAGNOSIS — R451 Restlessness and agitation: Secondary | ICD-10-CM | POA: Diagnosis not present

## 2018-07-05 DIAGNOSIS — R296 Repeated falls: Secondary | ICD-10-CM | POA: Diagnosis not present

## 2018-07-05 DIAGNOSIS — R260 Ataxic gait: Secondary | ICD-10-CM | POA: Diagnosis not present

## 2018-07-05 DIAGNOSIS — R531 Weakness: Secondary | ICD-10-CM | POA: Diagnosis not present

## 2018-07-05 DIAGNOSIS — F419 Anxiety disorder, unspecified: Secondary | ICD-10-CM | POA: Diagnosis not present

## 2018-07-08 DIAGNOSIS — R260 Ataxic gait: Secondary | ICD-10-CM | POA: Diagnosis not present

## 2018-07-08 DIAGNOSIS — R296 Repeated falls: Secondary | ICD-10-CM | POA: Diagnosis not present

## 2018-07-08 DIAGNOSIS — G20 Parkinson's disease: Secondary | ICD-10-CM | POA: Diagnosis not present

## 2018-07-08 DIAGNOSIS — F419 Anxiety disorder, unspecified: Secondary | ICD-10-CM | POA: Diagnosis not present

## 2018-07-08 DIAGNOSIS — R531 Weakness: Secondary | ICD-10-CM | POA: Diagnosis not present

## 2018-07-08 DIAGNOSIS — R451 Restlessness and agitation: Secondary | ICD-10-CM | POA: Diagnosis not present

## 2018-07-09 DIAGNOSIS — R451 Restlessness and agitation: Secondary | ICD-10-CM | POA: Diagnosis not present

## 2018-07-09 DIAGNOSIS — R531 Weakness: Secondary | ICD-10-CM | POA: Diagnosis not present

## 2018-07-09 DIAGNOSIS — R296 Repeated falls: Secondary | ICD-10-CM | POA: Diagnosis not present

## 2018-07-09 DIAGNOSIS — G20 Parkinson's disease: Secondary | ICD-10-CM | POA: Diagnosis not present

## 2018-07-09 DIAGNOSIS — R260 Ataxic gait: Secondary | ICD-10-CM | POA: Diagnosis not present

## 2018-07-09 DIAGNOSIS — F419 Anxiety disorder, unspecified: Secondary | ICD-10-CM | POA: Diagnosis not present

## 2018-07-10 DIAGNOSIS — G20 Parkinson's disease: Secondary | ICD-10-CM | POA: Diagnosis not present

## 2018-07-10 DIAGNOSIS — F419 Anxiety disorder, unspecified: Secondary | ICD-10-CM | POA: Diagnosis not present

## 2018-07-10 DIAGNOSIS — R451 Restlessness and agitation: Secondary | ICD-10-CM | POA: Diagnosis not present

## 2018-07-10 DIAGNOSIS — R260 Ataxic gait: Secondary | ICD-10-CM | POA: Diagnosis not present

## 2018-07-10 DIAGNOSIS — R296 Repeated falls: Secondary | ICD-10-CM | POA: Diagnosis not present

## 2018-07-10 DIAGNOSIS — R531 Weakness: Secondary | ICD-10-CM | POA: Diagnosis not present

## 2018-07-12 DIAGNOSIS — F419 Anxiety disorder, unspecified: Secondary | ICD-10-CM | POA: Diagnosis not present

## 2018-07-12 DIAGNOSIS — R296 Repeated falls: Secondary | ICD-10-CM | POA: Diagnosis not present

## 2018-07-12 DIAGNOSIS — G20 Parkinson's disease: Secondary | ICD-10-CM | POA: Diagnosis not present

## 2018-07-12 DIAGNOSIS — R260 Ataxic gait: Secondary | ICD-10-CM | POA: Diagnosis not present

## 2018-07-12 DIAGNOSIS — R531 Weakness: Secondary | ICD-10-CM | POA: Diagnosis not present

## 2018-07-12 DIAGNOSIS — R451 Restlessness and agitation: Secondary | ICD-10-CM | POA: Diagnosis not present

## 2018-07-15 DIAGNOSIS — R296 Repeated falls: Secondary | ICD-10-CM | POA: Diagnosis not present

## 2018-07-15 DIAGNOSIS — R531 Weakness: Secondary | ICD-10-CM | POA: Diagnosis not present

## 2018-07-15 DIAGNOSIS — R451 Restlessness and agitation: Secondary | ICD-10-CM | POA: Diagnosis not present

## 2018-07-15 DIAGNOSIS — R260 Ataxic gait: Secondary | ICD-10-CM | POA: Diagnosis not present

## 2018-07-15 DIAGNOSIS — G20 Parkinson's disease: Secondary | ICD-10-CM | POA: Diagnosis not present

## 2018-07-15 DIAGNOSIS — F419 Anxiety disorder, unspecified: Secondary | ICD-10-CM | POA: Diagnosis not present

## 2018-07-17 DIAGNOSIS — R296 Repeated falls: Secondary | ICD-10-CM | POA: Diagnosis not present

## 2018-07-17 DIAGNOSIS — R531 Weakness: Secondary | ICD-10-CM | POA: Diagnosis not present

## 2018-07-17 DIAGNOSIS — R451 Restlessness and agitation: Secondary | ICD-10-CM | POA: Diagnosis not present

## 2018-07-17 DIAGNOSIS — F419 Anxiety disorder, unspecified: Secondary | ICD-10-CM | POA: Diagnosis not present

## 2018-07-17 DIAGNOSIS — R260 Ataxic gait: Secondary | ICD-10-CM | POA: Diagnosis not present

## 2018-07-17 DIAGNOSIS — G20 Parkinson's disease: Secondary | ICD-10-CM | POA: Diagnosis not present

## 2018-07-19 DIAGNOSIS — G20 Parkinson's disease: Secondary | ICD-10-CM | POA: Diagnosis not present

## 2018-07-19 DIAGNOSIS — F419 Anxiety disorder, unspecified: Secondary | ICD-10-CM | POA: Diagnosis not present

## 2018-07-19 DIAGNOSIS — R451 Restlessness and agitation: Secondary | ICD-10-CM | POA: Diagnosis not present

## 2018-07-19 DIAGNOSIS — R296 Repeated falls: Secondary | ICD-10-CM | POA: Diagnosis not present

## 2018-07-19 DIAGNOSIS — R531 Weakness: Secondary | ICD-10-CM | POA: Diagnosis not present

## 2018-07-19 DIAGNOSIS — R260 Ataxic gait: Secondary | ICD-10-CM | POA: Diagnosis not present

## 2018-07-22 DIAGNOSIS — R296 Repeated falls: Secondary | ICD-10-CM | POA: Diagnosis not present

## 2018-07-22 DIAGNOSIS — F419 Anxiety disorder, unspecified: Secondary | ICD-10-CM | POA: Diagnosis not present

## 2018-07-22 DIAGNOSIS — R260 Ataxic gait: Secondary | ICD-10-CM | POA: Diagnosis not present

## 2018-07-22 DIAGNOSIS — R451 Restlessness and agitation: Secondary | ICD-10-CM | POA: Diagnosis not present

## 2018-07-22 DIAGNOSIS — R531 Weakness: Secondary | ICD-10-CM | POA: Diagnosis not present

## 2018-07-22 DIAGNOSIS — G20 Parkinson's disease: Secondary | ICD-10-CM | POA: Diagnosis not present

## 2018-07-24 DIAGNOSIS — R296 Repeated falls: Secondary | ICD-10-CM | POA: Diagnosis not present

## 2018-07-24 DIAGNOSIS — R451 Restlessness and agitation: Secondary | ICD-10-CM | POA: Diagnosis not present

## 2018-07-24 DIAGNOSIS — R531 Weakness: Secondary | ICD-10-CM | POA: Diagnosis not present

## 2018-07-24 DIAGNOSIS — G20 Parkinson's disease: Secondary | ICD-10-CM | POA: Diagnosis not present

## 2018-07-24 DIAGNOSIS — F419 Anxiety disorder, unspecified: Secondary | ICD-10-CM | POA: Diagnosis not present

## 2018-07-24 DIAGNOSIS — R260 Ataxic gait: Secondary | ICD-10-CM | POA: Diagnosis not present

## 2018-07-26 DIAGNOSIS — R260 Ataxic gait: Secondary | ICD-10-CM | POA: Diagnosis not present

## 2018-07-26 DIAGNOSIS — R296 Repeated falls: Secondary | ICD-10-CM | POA: Diagnosis not present

## 2018-07-26 DIAGNOSIS — F419 Anxiety disorder, unspecified: Secondary | ICD-10-CM | POA: Diagnosis not present

## 2018-07-26 DIAGNOSIS — R451 Restlessness and agitation: Secondary | ICD-10-CM | POA: Diagnosis not present

## 2018-07-26 DIAGNOSIS — G20 Parkinson's disease: Secondary | ICD-10-CM | POA: Diagnosis not present

## 2018-07-26 DIAGNOSIS — R531 Weakness: Secondary | ICD-10-CM | POA: Diagnosis not present

## 2018-07-27 DIAGNOSIS — R451 Restlessness and agitation: Secondary | ICD-10-CM | POA: Diagnosis not present

## 2018-07-27 DIAGNOSIS — F419 Anxiety disorder, unspecified: Secondary | ICD-10-CM | POA: Diagnosis not present

## 2018-07-27 DIAGNOSIS — G20 Parkinson's disease: Secondary | ICD-10-CM | POA: Diagnosis not present

## 2018-07-27 DIAGNOSIS — R296 Repeated falls: Secondary | ICD-10-CM | POA: Diagnosis not present

## 2018-07-27 DIAGNOSIS — R531 Weakness: Secondary | ICD-10-CM | POA: Diagnosis not present

## 2018-07-27 DIAGNOSIS — R260 Ataxic gait: Secondary | ICD-10-CM | POA: Diagnosis not present

## 2018-08-01 DIAGNOSIS — R296 Repeated falls: Secondary | ICD-10-CM | POA: Diagnosis not present

## 2018-08-01 DIAGNOSIS — G20 Parkinson's disease: Secondary | ICD-10-CM | POA: Diagnosis not present

## 2018-08-01 DIAGNOSIS — R451 Restlessness and agitation: Secondary | ICD-10-CM | POA: Diagnosis not present

## 2018-08-01 DIAGNOSIS — R531 Weakness: Secondary | ICD-10-CM | POA: Diagnosis not present

## 2018-08-01 DIAGNOSIS — F419 Anxiety disorder, unspecified: Secondary | ICD-10-CM | POA: Diagnosis not present

## 2018-08-01 DIAGNOSIS — R260 Ataxic gait: Secondary | ICD-10-CM | POA: Diagnosis not present

## 2018-08-02 DIAGNOSIS — R296 Repeated falls: Secondary | ICD-10-CM | POA: Diagnosis not present

## 2018-08-02 DIAGNOSIS — R260 Ataxic gait: Secondary | ICD-10-CM | POA: Diagnosis not present

## 2018-08-02 DIAGNOSIS — R634 Abnormal weight loss: Secondary | ICD-10-CM | POA: Diagnosis not present

## 2018-08-02 DIAGNOSIS — Z66 Do not resuscitate: Secondary | ICD-10-CM | POA: Diagnosis not present

## 2018-08-02 DIAGNOSIS — G20 Parkinson's disease: Secondary | ICD-10-CM | POA: Diagnosis not present

## 2018-08-02 DIAGNOSIS — Z741 Need for assistance with personal care: Secondary | ICD-10-CM | POA: Diagnosis not present

## 2018-08-02 DIAGNOSIS — R471 Dysarthria and anarthria: Secondary | ICD-10-CM | POA: Diagnosis not present

## 2018-08-02 DIAGNOSIS — R451 Restlessness and agitation: Secondary | ICD-10-CM | POA: Diagnosis not present

## 2018-08-02 DIAGNOSIS — Z743 Need for continuous supervision: Secondary | ICD-10-CM | POA: Diagnosis not present

## 2018-08-02 DIAGNOSIS — F419 Anxiety disorder, unspecified: Secondary | ICD-10-CM | POA: Diagnosis not present

## 2018-08-02 DIAGNOSIS — R531 Weakness: Secondary | ICD-10-CM | POA: Diagnosis not present

## 2018-08-05 DIAGNOSIS — R531 Weakness: Secondary | ICD-10-CM | POA: Diagnosis not present

## 2018-08-05 DIAGNOSIS — R296 Repeated falls: Secondary | ICD-10-CM | POA: Diagnosis not present

## 2018-08-05 DIAGNOSIS — F419 Anxiety disorder, unspecified: Secondary | ICD-10-CM | POA: Diagnosis not present

## 2018-08-05 DIAGNOSIS — R451 Restlessness and agitation: Secondary | ICD-10-CM | POA: Diagnosis not present

## 2018-08-05 DIAGNOSIS — R260 Ataxic gait: Secondary | ICD-10-CM | POA: Diagnosis not present

## 2018-08-05 DIAGNOSIS — G20 Parkinson's disease: Secondary | ICD-10-CM | POA: Diagnosis not present

## 2018-08-06 DIAGNOSIS — R451 Restlessness and agitation: Secondary | ICD-10-CM | POA: Diagnosis not present

## 2018-08-06 DIAGNOSIS — R260 Ataxic gait: Secondary | ICD-10-CM | POA: Diagnosis not present

## 2018-08-06 DIAGNOSIS — G20 Parkinson's disease: Secondary | ICD-10-CM | POA: Diagnosis not present

## 2018-08-06 DIAGNOSIS — F419 Anxiety disorder, unspecified: Secondary | ICD-10-CM | POA: Diagnosis not present

## 2018-08-06 DIAGNOSIS — R531 Weakness: Secondary | ICD-10-CM | POA: Diagnosis not present

## 2018-08-06 DIAGNOSIS — R296 Repeated falls: Secondary | ICD-10-CM | POA: Diagnosis not present

## 2018-08-07 DIAGNOSIS — R260 Ataxic gait: Secondary | ICD-10-CM | POA: Diagnosis not present

## 2018-08-07 DIAGNOSIS — R296 Repeated falls: Secondary | ICD-10-CM | POA: Diagnosis not present

## 2018-08-07 DIAGNOSIS — G20 Parkinson's disease: Secondary | ICD-10-CM | POA: Diagnosis not present

## 2018-08-07 DIAGNOSIS — R451 Restlessness and agitation: Secondary | ICD-10-CM | POA: Diagnosis not present

## 2018-08-07 DIAGNOSIS — R531 Weakness: Secondary | ICD-10-CM | POA: Diagnosis not present

## 2018-08-07 DIAGNOSIS — F419 Anxiety disorder, unspecified: Secondary | ICD-10-CM | POA: Diagnosis not present

## 2018-08-09 DIAGNOSIS — R531 Weakness: Secondary | ICD-10-CM | POA: Diagnosis not present

## 2018-08-09 DIAGNOSIS — G20 Parkinson's disease: Secondary | ICD-10-CM | POA: Diagnosis not present

## 2018-08-09 DIAGNOSIS — R451 Restlessness and agitation: Secondary | ICD-10-CM | POA: Diagnosis not present

## 2018-08-09 DIAGNOSIS — R260 Ataxic gait: Secondary | ICD-10-CM | POA: Diagnosis not present

## 2018-08-09 DIAGNOSIS — F419 Anxiety disorder, unspecified: Secondary | ICD-10-CM | POA: Diagnosis not present

## 2018-08-09 DIAGNOSIS — R296 Repeated falls: Secondary | ICD-10-CM | POA: Diagnosis not present

## 2018-08-12 DIAGNOSIS — R296 Repeated falls: Secondary | ICD-10-CM | POA: Diagnosis not present

## 2018-08-12 DIAGNOSIS — F419 Anxiety disorder, unspecified: Secondary | ICD-10-CM | POA: Diagnosis not present

## 2018-08-12 DIAGNOSIS — R451 Restlessness and agitation: Secondary | ICD-10-CM | POA: Diagnosis not present

## 2018-08-12 DIAGNOSIS — G20 Parkinson's disease: Secondary | ICD-10-CM | POA: Diagnosis not present

## 2018-08-12 DIAGNOSIS — R260 Ataxic gait: Secondary | ICD-10-CM | POA: Diagnosis not present

## 2018-08-12 DIAGNOSIS — R531 Weakness: Secondary | ICD-10-CM | POA: Diagnosis not present

## 2018-08-13 DIAGNOSIS — F419 Anxiety disorder, unspecified: Secondary | ICD-10-CM | POA: Diagnosis not present

## 2018-08-13 DIAGNOSIS — R451 Restlessness and agitation: Secondary | ICD-10-CM | POA: Diagnosis not present

## 2018-08-13 DIAGNOSIS — R531 Weakness: Secondary | ICD-10-CM | POA: Diagnosis not present

## 2018-08-13 DIAGNOSIS — R296 Repeated falls: Secondary | ICD-10-CM | POA: Diagnosis not present

## 2018-08-13 DIAGNOSIS — G20 Parkinson's disease: Secondary | ICD-10-CM | POA: Diagnosis not present

## 2018-08-13 DIAGNOSIS — R260 Ataxic gait: Secondary | ICD-10-CM | POA: Diagnosis not present

## 2018-08-14 DIAGNOSIS — R260 Ataxic gait: Secondary | ICD-10-CM | POA: Diagnosis not present

## 2018-08-14 DIAGNOSIS — R451 Restlessness and agitation: Secondary | ICD-10-CM | POA: Diagnosis not present

## 2018-08-14 DIAGNOSIS — R296 Repeated falls: Secondary | ICD-10-CM | POA: Diagnosis not present

## 2018-08-14 DIAGNOSIS — F419 Anxiety disorder, unspecified: Secondary | ICD-10-CM | POA: Diagnosis not present

## 2018-08-14 DIAGNOSIS — G20 Parkinson's disease: Secondary | ICD-10-CM | POA: Diagnosis not present

## 2018-08-14 DIAGNOSIS — R531 Weakness: Secondary | ICD-10-CM | POA: Diagnosis not present

## 2018-08-16 DIAGNOSIS — G20 Parkinson's disease: Secondary | ICD-10-CM | POA: Diagnosis not present

## 2018-08-16 DIAGNOSIS — R296 Repeated falls: Secondary | ICD-10-CM | POA: Diagnosis not present

## 2018-08-16 DIAGNOSIS — F419 Anxiety disorder, unspecified: Secondary | ICD-10-CM | POA: Diagnosis not present

## 2018-08-16 DIAGNOSIS — R260 Ataxic gait: Secondary | ICD-10-CM | POA: Diagnosis not present

## 2018-08-16 DIAGNOSIS — R531 Weakness: Secondary | ICD-10-CM | POA: Diagnosis not present

## 2018-08-16 DIAGNOSIS — R451 Restlessness and agitation: Secondary | ICD-10-CM | POA: Diagnosis not present

## 2018-08-19 DIAGNOSIS — F419 Anxiety disorder, unspecified: Secondary | ICD-10-CM | POA: Diagnosis not present

## 2018-08-19 DIAGNOSIS — R531 Weakness: Secondary | ICD-10-CM | POA: Diagnosis not present

## 2018-08-19 DIAGNOSIS — R296 Repeated falls: Secondary | ICD-10-CM | POA: Diagnosis not present

## 2018-08-19 DIAGNOSIS — R451 Restlessness and agitation: Secondary | ICD-10-CM | POA: Diagnosis not present

## 2018-08-19 DIAGNOSIS — R260 Ataxic gait: Secondary | ICD-10-CM | POA: Diagnosis not present

## 2018-08-19 DIAGNOSIS — G20 Parkinson's disease: Secondary | ICD-10-CM | POA: Diagnosis not present

## 2018-08-20 DIAGNOSIS — R451 Restlessness and agitation: Secondary | ICD-10-CM | POA: Diagnosis not present

## 2018-08-20 DIAGNOSIS — R296 Repeated falls: Secondary | ICD-10-CM | POA: Diagnosis not present

## 2018-08-20 DIAGNOSIS — R531 Weakness: Secondary | ICD-10-CM | POA: Diagnosis not present

## 2018-08-20 DIAGNOSIS — R260 Ataxic gait: Secondary | ICD-10-CM | POA: Diagnosis not present

## 2018-08-20 DIAGNOSIS — F419 Anxiety disorder, unspecified: Secondary | ICD-10-CM | POA: Diagnosis not present

## 2018-08-20 DIAGNOSIS — G20 Parkinson's disease: Secondary | ICD-10-CM | POA: Diagnosis not present

## 2018-08-21 DIAGNOSIS — F419 Anxiety disorder, unspecified: Secondary | ICD-10-CM | POA: Diagnosis not present

## 2018-08-21 DIAGNOSIS — R296 Repeated falls: Secondary | ICD-10-CM | POA: Diagnosis not present

## 2018-08-21 DIAGNOSIS — R451 Restlessness and agitation: Secondary | ICD-10-CM | POA: Diagnosis not present

## 2018-08-21 DIAGNOSIS — R260 Ataxic gait: Secondary | ICD-10-CM | POA: Diagnosis not present

## 2018-08-21 DIAGNOSIS — G20 Parkinson's disease: Secondary | ICD-10-CM | POA: Diagnosis not present

## 2018-08-21 DIAGNOSIS — R531 Weakness: Secondary | ICD-10-CM | POA: Diagnosis not present

## 2018-08-23 DIAGNOSIS — G20 Parkinson's disease: Secondary | ICD-10-CM | POA: Diagnosis not present

## 2018-08-23 DIAGNOSIS — R531 Weakness: Secondary | ICD-10-CM | POA: Diagnosis not present

## 2018-08-23 DIAGNOSIS — R260 Ataxic gait: Secondary | ICD-10-CM | POA: Diagnosis not present

## 2018-08-23 DIAGNOSIS — R296 Repeated falls: Secondary | ICD-10-CM | POA: Diagnosis not present

## 2018-08-23 DIAGNOSIS — R451 Restlessness and agitation: Secondary | ICD-10-CM | POA: Diagnosis not present

## 2018-08-23 DIAGNOSIS — F419 Anxiety disorder, unspecified: Secondary | ICD-10-CM | POA: Diagnosis not present

## 2018-08-26 DIAGNOSIS — R531 Weakness: Secondary | ICD-10-CM | POA: Diagnosis not present

## 2018-08-26 DIAGNOSIS — G20 Parkinson's disease: Secondary | ICD-10-CM | POA: Diagnosis not present

## 2018-08-26 DIAGNOSIS — R296 Repeated falls: Secondary | ICD-10-CM | POA: Diagnosis not present

## 2018-08-26 DIAGNOSIS — R260 Ataxic gait: Secondary | ICD-10-CM | POA: Diagnosis not present

## 2018-08-26 DIAGNOSIS — R451 Restlessness and agitation: Secondary | ICD-10-CM | POA: Diagnosis not present

## 2018-08-26 DIAGNOSIS — F419 Anxiety disorder, unspecified: Secondary | ICD-10-CM | POA: Diagnosis not present

## 2018-08-27 DIAGNOSIS — G20 Parkinson's disease: Secondary | ICD-10-CM | POA: Diagnosis not present

## 2018-08-27 DIAGNOSIS — R296 Repeated falls: Secondary | ICD-10-CM | POA: Diagnosis not present

## 2018-08-27 DIAGNOSIS — F419 Anxiety disorder, unspecified: Secondary | ICD-10-CM | POA: Diagnosis not present

## 2018-08-27 DIAGNOSIS — R451 Restlessness and agitation: Secondary | ICD-10-CM | POA: Diagnosis not present

## 2018-08-27 DIAGNOSIS — R260 Ataxic gait: Secondary | ICD-10-CM | POA: Diagnosis not present

## 2018-08-27 DIAGNOSIS — R531 Weakness: Secondary | ICD-10-CM | POA: Diagnosis not present

## 2018-08-28 DIAGNOSIS — F419 Anxiety disorder, unspecified: Secondary | ICD-10-CM | POA: Diagnosis not present

## 2018-08-28 DIAGNOSIS — R296 Repeated falls: Secondary | ICD-10-CM | POA: Diagnosis not present

## 2018-08-28 DIAGNOSIS — R531 Weakness: Secondary | ICD-10-CM | POA: Diagnosis not present

## 2018-08-28 DIAGNOSIS — R451 Restlessness and agitation: Secondary | ICD-10-CM | POA: Diagnosis not present

## 2018-08-28 DIAGNOSIS — G20 Parkinson's disease: Secondary | ICD-10-CM | POA: Diagnosis not present

## 2018-08-28 DIAGNOSIS — R260 Ataxic gait: Secondary | ICD-10-CM | POA: Diagnosis not present

## 2018-09-01 DIAGNOSIS — R451 Restlessness and agitation: Secondary | ICD-10-CM | POA: Diagnosis not present

## 2018-09-01 DIAGNOSIS — F419 Anxiety disorder, unspecified: Secondary | ICD-10-CM | POA: Diagnosis not present

## 2018-09-01 DIAGNOSIS — R471 Dysarthria and anarthria: Secondary | ICD-10-CM | POA: Diagnosis not present

## 2018-09-01 DIAGNOSIS — G20 Parkinson's disease: Secondary | ICD-10-CM | POA: Diagnosis not present

## 2018-09-01 DIAGNOSIS — R634 Abnormal weight loss: Secondary | ICD-10-CM | POA: Diagnosis not present

## 2018-09-01 DIAGNOSIS — Z743 Need for continuous supervision: Secondary | ICD-10-CM | POA: Diagnosis not present

## 2018-09-01 DIAGNOSIS — R296 Repeated falls: Secondary | ICD-10-CM | POA: Diagnosis not present

## 2018-09-01 DIAGNOSIS — Z741 Need for assistance with personal care: Secondary | ICD-10-CM | POA: Diagnosis not present

## 2018-09-01 DIAGNOSIS — R260 Ataxic gait: Secondary | ICD-10-CM | POA: Diagnosis not present

## 2018-09-01 DIAGNOSIS — R531 Weakness: Secondary | ICD-10-CM | POA: Diagnosis not present

## 2018-09-02 DIAGNOSIS — R296 Repeated falls: Secondary | ICD-10-CM | POA: Diagnosis not present

## 2018-09-02 DIAGNOSIS — R531 Weakness: Secondary | ICD-10-CM | POA: Diagnosis not present

## 2018-09-02 DIAGNOSIS — R260 Ataxic gait: Secondary | ICD-10-CM | POA: Diagnosis not present

## 2018-09-02 DIAGNOSIS — F419 Anxiety disorder, unspecified: Secondary | ICD-10-CM | POA: Diagnosis not present

## 2018-09-02 DIAGNOSIS — G20 Parkinson's disease: Secondary | ICD-10-CM | POA: Diagnosis not present

## 2018-09-02 DIAGNOSIS — R451 Restlessness and agitation: Secondary | ICD-10-CM | POA: Diagnosis not present

## 2018-09-03 DIAGNOSIS — F419 Anxiety disorder, unspecified: Secondary | ICD-10-CM | POA: Diagnosis not present

## 2018-09-03 DIAGNOSIS — R451 Restlessness and agitation: Secondary | ICD-10-CM | POA: Diagnosis not present

## 2018-09-03 DIAGNOSIS — G20 Parkinson's disease: Secondary | ICD-10-CM | POA: Diagnosis not present

## 2018-09-03 DIAGNOSIS — R260 Ataxic gait: Secondary | ICD-10-CM | POA: Diagnosis not present

## 2018-09-03 DIAGNOSIS — R296 Repeated falls: Secondary | ICD-10-CM | POA: Diagnosis not present

## 2018-09-03 DIAGNOSIS — R531 Weakness: Secondary | ICD-10-CM | POA: Diagnosis not present

## 2018-09-04 DIAGNOSIS — R296 Repeated falls: Secondary | ICD-10-CM | POA: Diagnosis not present

## 2018-09-04 DIAGNOSIS — G20 Parkinson's disease: Secondary | ICD-10-CM | POA: Diagnosis not present

## 2018-09-04 DIAGNOSIS — F419 Anxiety disorder, unspecified: Secondary | ICD-10-CM | POA: Diagnosis not present

## 2018-09-04 DIAGNOSIS — R260 Ataxic gait: Secondary | ICD-10-CM | POA: Diagnosis not present

## 2018-09-04 DIAGNOSIS — R531 Weakness: Secondary | ICD-10-CM | POA: Diagnosis not present

## 2018-09-04 DIAGNOSIS — R451 Restlessness and agitation: Secondary | ICD-10-CM | POA: Diagnosis not present

## 2018-09-06 DIAGNOSIS — R296 Repeated falls: Secondary | ICD-10-CM | POA: Diagnosis not present

## 2018-09-06 DIAGNOSIS — G20 Parkinson's disease: Secondary | ICD-10-CM | POA: Diagnosis not present

## 2018-09-06 DIAGNOSIS — R531 Weakness: Secondary | ICD-10-CM | POA: Diagnosis not present

## 2018-09-06 DIAGNOSIS — R451 Restlessness and agitation: Secondary | ICD-10-CM | POA: Diagnosis not present

## 2018-09-06 DIAGNOSIS — F419 Anxiety disorder, unspecified: Secondary | ICD-10-CM | POA: Diagnosis not present

## 2018-09-06 DIAGNOSIS — R260 Ataxic gait: Secondary | ICD-10-CM | POA: Diagnosis not present

## 2018-09-09 DIAGNOSIS — R451 Restlessness and agitation: Secondary | ICD-10-CM | POA: Diagnosis not present

## 2018-09-09 DIAGNOSIS — R296 Repeated falls: Secondary | ICD-10-CM | POA: Diagnosis not present

## 2018-09-09 DIAGNOSIS — G20 Parkinson's disease: Secondary | ICD-10-CM | POA: Diagnosis not present

## 2018-09-09 DIAGNOSIS — R260 Ataxic gait: Secondary | ICD-10-CM | POA: Diagnosis not present

## 2018-09-09 DIAGNOSIS — F419 Anxiety disorder, unspecified: Secondary | ICD-10-CM | POA: Diagnosis not present

## 2018-09-09 DIAGNOSIS — R531 Weakness: Secondary | ICD-10-CM | POA: Diagnosis not present

## 2018-09-10 DIAGNOSIS — R296 Repeated falls: Secondary | ICD-10-CM | POA: Diagnosis not present

## 2018-09-10 DIAGNOSIS — R531 Weakness: Secondary | ICD-10-CM | POA: Diagnosis not present

## 2018-09-10 DIAGNOSIS — R260 Ataxic gait: Secondary | ICD-10-CM | POA: Diagnosis not present

## 2018-09-10 DIAGNOSIS — R451 Restlessness and agitation: Secondary | ICD-10-CM | POA: Diagnosis not present

## 2018-09-10 DIAGNOSIS — G20 Parkinson's disease: Secondary | ICD-10-CM | POA: Diagnosis not present

## 2018-09-10 DIAGNOSIS — F419 Anxiety disorder, unspecified: Secondary | ICD-10-CM | POA: Diagnosis not present

## 2018-09-11 DIAGNOSIS — R531 Weakness: Secondary | ICD-10-CM | POA: Diagnosis not present

## 2018-09-11 DIAGNOSIS — R260 Ataxic gait: Secondary | ICD-10-CM | POA: Diagnosis not present

## 2018-09-11 DIAGNOSIS — G20 Parkinson's disease: Secondary | ICD-10-CM | POA: Diagnosis not present

## 2018-09-11 DIAGNOSIS — R451 Restlessness and agitation: Secondary | ICD-10-CM | POA: Diagnosis not present

## 2018-09-11 DIAGNOSIS — R296 Repeated falls: Secondary | ICD-10-CM | POA: Diagnosis not present

## 2018-09-11 DIAGNOSIS — F419 Anxiety disorder, unspecified: Secondary | ICD-10-CM | POA: Diagnosis not present

## 2018-09-13 DIAGNOSIS — F419 Anxiety disorder, unspecified: Secondary | ICD-10-CM | POA: Diagnosis not present

## 2018-09-13 DIAGNOSIS — R296 Repeated falls: Secondary | ICD-10-CM | POA: Diagnosis not present

## 2018-09-13 DIAGNOSIS — R260 Ataxic gait: Secondary | ICD-10-CM | POA: Diagnosis not present

## 2018-09-13 DIAGNOSIS — G20 Parkinson's disease: Secondary | ICD-10-CM | POA: Diagnosis not present

## 2018-09-13 DIAGNOSIS — R451 Restlessness and agitation: Secondary | ICD-10-CM | POA: Diagnosis not present

## 2018-09-13 DIAGNOSIS — R531 Weakness: Secondary | ICD-10-CM | POA: Diagnosis not present

## 2018-09-16 DIAGNOSIS — R260 Ataxic gait: Secondary | ICD-10-CM | POA: Diagnosis not present

## 2018-09-16 DIAGNOSIS — G20 Parkinson's disease: Secondary | ICD-10-CM | POA: Diagnosis not present

## 2018-09-16 DIAGNOSIS — R296 Repeated falls: Secondary | ICD-10-CM | POA: Diagnosis not present

## 2018-09-16 DIAGNOSIS — R531 Weakness: Secondary | ICD-10-CM | POA: Diagnosis not present

## 2018-09-16 DIAGNOSIS — F419 Anxiety disorder, unspecified: Secondary | ICD-10-CM | POA: Diagnosis not present

## 2018-09-16 DIAGNOSIS — R451 Restlessness and agitation: Secondary | ICD-10-CM | POA: Diagnosis not present

## 2018-09-17 DIAGNOSIS — F419 Anxiety disorder, unspecified: Secondary | ICD-10-CM | POA: Diagnosis not present

## 2018-09-17 DIAGNOSIS — G20 Parkinson's disease: Secondary | ICD-10-CM | POA: Diagnosis not present

## 2018-09-17 DIAGNOSIS — R296 Repeated falls: Secondary | ICD-10-CM | POA: Diagnosis not present

## 2018-09-17 DIAGNOSIS — R531 Weakness: Secondary | ICD-10-CM | POA: Diagnosis not present

## 2018-09-17 DIAGNOSIS — R260 Ataxic gait: Secondary | ICD-10-CM | POA: Diagnosis not present

## 2018-09-17 DIAGNOSIS — R451 Restlessness and agitation: Secondary | ICD-10-CM | POA: Diagnosis not present

## 2018-09-18 DIAGNOSIS — F419 Anxiety disorder, unspecified: Secondary | ICD-10-CM | POA: Diagnosis not present

## 2018-09-18 DIAGNOSIS — R260 Ataxic gait: Secondary | ICD-10-CM | POA: Diagnosis not present

## 2018-09-18 DIAGNOSIS — R451 Restlessness and agitation: Secondary | ICD-10-CM | POA: Diagnosis not present

## 2018-09-18 DIAGNOSIS — R296 Repeated falls: Secondary | ICD-10-CM | POA: Diagnosis not present

## 2018-09-18 DIAGNOSIS — R531 Weakness: Secondary | ICD-10-CM | POA: Diagnosis not present

## 2018-09-18 DIAGNOSIS — G20 Parkinson's disease: Secondary | ICD-10-CM | POA: Diagnosis not present

## 2018-09-20 DIAGNOSIS — R296 Repeated falls: Secondary | ICD-10-CM | POA: Diagnosis not present

## 2018-09-20 DIAGNOSIS — R531 Weakness: Secondary | ICD-10-CM | POA: Diagnosis not present

## 2018-09-20 DIAGNOSIS — G20 Parkinson's disease: Secondary | ICD-10-CM | POA: Diagnosis not present

## 2018-09-20 DIAGNOSIS — R451 Restlessness and agitation: Secondary | ICD-10-CM | POA: Diagnosis not present

## 2018-09-20 DIAGNOSIS — F419 Anxiety disorder, unspecified: Secondary | ICD-10-CM | POA: Diagnosis not present

## 2018-09-20 DIAGNOSIS — R260 Ataxic gait: Secondary | ICD-10-CM | POA: Diagnosis not present

## 2018-09-23 DIAGNOSIS — R260 Ataxic gait: Secondary | ICD-10-CM | POA: Diagnosis not present

## 2018-09-23 DIAGNOSIS — R531 Weakness: Secondary | ICD-10-CM | POA: Diagnosis not present

## 2018-09-23 DIAGNOSIS — F419 Anxiety disorder, unspecified: Secondary | ICD-10-CM | POA: Diagnosis not present

## 2018-09-23 DIAGNOSIS — R451 Restlessness and agitation: Secondary | ICD-10-CM | POA: Diagnosis not present

## 2018-09-23 DIAGNOSIS — G20 Parkinson's disease: Secondary | ICD-10-CM | POA: Diagnosis not present

## 2018-09-23 DIAGNOSIS — R296 Repeated falls: Secondary | ICD-10-CM | POA: Diagnosis not present

## 2018-09-26 DIAGNOSIS — G20 Parkinson's disease: Secondary | ICD-10-CM | POA: Diagnosis not present

## 2018-09-26 DIAGNOSIS — R296 Repeated falls: Secondary | ICD-10-CM | POA: Diagnosis not present

## 2018-09-26 DIAGNOSIS — R451 Restlessness and agitation: Secondary | ICD-10-CM | POA: Diagnosis not present

## 2018-09-26 DIAGNOSIS — F419 Anxiety disorder, unspecified: Secondary | ICD-10-CM | POA: Diagnosis not present

## 2018-09-26 DIAGNOSIS — R260 Ataxic gait: Secondary | ICD-10-CM | POA: Diagnosis not present

## 2018-09-26 DIAGNOSIS — R531 Weakness: Secondary | ICD-10-CM | POA: Diagnosis not present

## 2018-09-27 DIAGNOSIS — R260 Ataxic gait: Secondary | ICD-10-CM | POA: Diagnosis not present

## 2018-09-27 DIAGNOSIS — R451 Restlessness and agitation: Secondary | ICD-10-CM | POA: Diagnosis not present

## 2018-09-27 DIAGNOSIS — R296 Repeated falls: Secondary | ICD-10-CM | POA: Diagnosis not present

## 2018-09-27 DIAGNOSIS — G20 Parkinson's disease: Secondary | ICD-10-CM | POA: Diagnosis not present

## 2018-09-27 DIAGNOSIS — F419 Anxiety disorder, unspecified: Secondary | ICD-10-CM | POA: Diagnosis not present

## 2018-09-27 DIAGNOSIS — R531 Weakness: Secondary | ICD-10-CM | POA: Diagnosis not present

## 2018-09-30 DIAGNOSIS — R531 Weakness: Secondary | ICD-10-CM | POA: Diagnosis not present

## 2018-09-30 DIAGNOSIS — R260 Ataxic gait: Secondary | ICD-10-CM | POA: Diagnosis not present

## 2018-09-30 DIAGNOSIS — G20 Parkinson's disease: Secondary | ICD-10-CM | POA: Diagnosis not present

## 2018-09-30 DIAGNOSIS — F419 Anxiety disorder, unspecified: Secondary | ICD-10-CM | POA: Diagnosis not present

## 2018-09-30 DIAGNOSIS — R451 Restlessness and agitation: Secondary | ICD-10-CM | POA: Diagnosis not present

## 2018-09-30 DIAGNOSIS — R296 Repeated falls: Secondary | ICD-10-CM | POA: Diagnosis not present

## 2018-10-01 DIAGNOSIS — R260 Ataxic gait: Secondary | ICD-10-CM | POA: Diagnosis not present

## 2018-10-01 DIAGNOSIS — R296 Repeated falls: Secondary | ICD-10-CM | POA: Diagnosis not present

## 2018-10-01 DIAGNOSIS — G20 Parkinson's disease: Secondary | ICD-10-CM | POA: Diagnosis not present

## 2018-10-01 DIAGNOSIS — R451 Restlessness and agitation: Secondary | ICD-10-CM | POA: Diagnosis not present

## 2018-10-01 DIAGNOSIS — F419 Anxiety disorder, unspecified: Secondary | ICD-10-CM | POA: Diagnosis not present

## 2018-10-01 DIAGNOSIS — R531 Weakness: Secondary | ICD-10-CM | POA: Diagnosis not present

## 2019-01-31 DEATH — deceased
